# Patient Record
Sex: Female | Born: 1960
Health system: Southern US, Community
[De-identification: ages and names within clinical notes are randomized; demographics above are authoritative.]

## PROBLEM LIST (undated history)

## (undated) DIAGNOSIS — J159 Unspecified bacterial pneumonia: Secondary | ICD-10-CM

## (undated) DIAGNOSIS — G43909 Migraine, unspecified, not intractable, without status migrainosus: Secondary | ICD-10-CM

## (undated) DIAGNOSIS — D249 Benign neoplasm of unspecified breast: Secondary | ICD-10-CM

## (undated) DIAGNOSIS — F419 Anxiety disorder, unspecified: Secondary | ICD-10-CM

## (undated) DIAGNOSIS — Z803 Family history of malignant neoplasm of breast: Secondary | ICD-10-CM

## (undated) DIAGNOSIS — Z1371 Encounter for nonprocreative screening for genetic disease carrier status: Secondary | ICD-10-CM

## (undated) DIAGNOSIS — F319 Bipolar disorder, unspecified: Secondary | ICD-10-CM

## (undated) DIAGNOSIS — IMO0002 Reserved for concepts with insufficient information to code with codable children: Secondary | ICD-10-CM

## (undated) DIAGNOSIS — F32A Depression, unspecified: Secondary | ICD-10-CM

## (undated) DIAGNOSIS — E559 Vitamin D deficiency, unspecified: Secondary | ICD-10-CM

## (undated) DIAGNOSIS — K589 Irritable bowel syndrome without diarrhea: Secondary | ICD-10-CM

## (undated) DIAGNOSIS — F329 Major depressive disorder, single episode, unspecified: Secondary | ICD-10-CM

## (undated) DIAGNOSIS — Z9189 Other specified personal risk factors, not elsewhere classified: Secondary | ICD-10-CM

## (undated) DIAGNOSIS — J309 Allergic rhinitis, unspecified: Secondary | ICD-10-CM

## (undated) DIAGNOSIS — L719 Rosacea, unspecified: Secondary | ICD-10-CM

## (undated) DIAGNOSIS — J45909 Unspecified asthma, uncomplicated: Secondary | ICD-10-CM

## (undated) HISTORY — DX: Unspecified asthma, uncomplicated: J45.909

## (undated) HISTORY — PX: OTHER SURGICAL HISTORY: SHX169

## (undated) HISTORY — DX: Unspecified bacterial pneumonia: J15.9

## (undated) HISTORY — DX: Reserved for concepts with insufficient information to code with codable children: IMO0002

## (undated) HISTORY — DX: Irritable bowel syndrome, unspecified: K58.9

## (undated) HISTORY — DX: Encounter for nonprocreative screening for genetic disease carrier status: Z13.71

## (undated) HISTORY — DX: Family history of malignant neoplasm of breast: Z80.3

## (undated) HISTORY — DX: Depression, unspecified: F32.A

## (undated) HISTORY — DX: Allergic rhinitis, unspecified: J30.9

## (undated) HISTORY — DX: Major depressive disorder, single episode, unspecified: F32.9

## (undated) HISTORY — DX: Bipolar disorder, unspecified: F31.9

## (undated) HISTORY — PX: PECTUS EXCAVATUM REPAIR: SHX437

## (undated) HISTORY — DX: Anxiety disorder, unspecified: F41.9

## (undated) HISTORY — DX: Vitamin D deficiency, unspecified: E55.9

## (undated) HISTORY — DX: Migraine, unspecified, not intractable, without status migrainosus: G43.909

## (undated) HISTORY — PX: WISDOM TOOTH EXTRACTION: SHX21

## (undated) HISTORY — DX: Rosacea, unspecified: L71.9

## (undated) HISTORY — DX: Other specified personal risk factors, not elsewhere classified: Z91.89

## (undated) HISTORY — DX: Benign neoplasm of unspecified breast: D24.9

## (undated) HISTORY — PX: TONSILLECTOMY: SUR1361

---

## 1998-07-16 HISTORY — PX: BREAST BIOPSY: SHX20

## 2002-07-16 HISTORY — PX: NASAL SINUS SURGERY: SHX719

## 2004-07-11 ENCOUNTER — Ambulatory Visit: Payer: Self-pay

## 2005-07-16 HISTORY — PX: BREAST CYST ASPIRATION: SHX578

## 2006-04-12 ENCOUNTER — Ambulatory Visit: Payer: Self-pay

## 2006-04-15 HISTORY — PX: BREAST CYST ASPIRATION: SHX578

## 2006-10-16 ENCOUNTER — Ambulatory Visit: Payer: Self-pay | Admitting: General Surgery

## 2007-01-09 ENCOUNTER — Emergency Department: Payer: Self-pay | Admitting: Emergency Medicine

## 2007-04-19 ENCOUNTER — Observation Stay: Payer: Self-pay | Admitting: Internal Medicine

## 2007-11-27 ENCOUNTER — Ambulatory Visit: Payer: Self-pay

## 2008-12-14 DIAGNOSIS — J159 Unspecified bacterial pneumonia: Secondary | ICD-10-CM

## 2008-12-14 HISTORY — DX: Unspecified bacterial pneumonia: J15.9

## 2008-12-15 ENCOUNTER — Inpatient Hospital Stay: Payer: Self-pay | Admitting: Internal Medicine

## 2009-01-05 ENCOUNTER — Ambulatory Visit: Payer: Self-pay | Admitting: Internal Medicine

## 2009-01-20 ENCOUNTER — Ambulatory Visit: Payer: Self-pay | Admitting: Specialist

## 2009-04-05 ENCOUNTER — Ambulatory Visit: Payer: Self-pay

## 2009-04-14 ENCOUNTER — Ambulatory Visit: Payer: Self-pay | Admitting: Unknown Physician Specialty

## 2009-05-31 ENCOUNTER — Ambulatory Visit: Payer: Self-pay | Admitting: Specialist

## 2009-06-06 ENCOUNTER — Ambulatory Visit: Payer: Self-pay | Admitting: Unknown Physician Specialty

## 2010-04-10 ENCOUNTER — Ambulatory Visit: Payer: Self-pay

## 2011-07-17 DIAGNOSIS — Z1371 Encounter for nonprocreative screening for genetic disease carrier status: Secondary | ICD-10-CM

## 2011-07-17 HISTORY — DX: Encounter for nonprocreative screening for genetic disease carrier status: Z13.71

## 2012-08-29 LAB — DRUG SCREEN, URINE
Barbiturates, Ur Screen: NEGATIVE (ref ?–200)
Cannabinoid 50 Ng, Ur ~~LOC~~: NEGATIVE (ref ?–50)
Methadone, Ur Screen: NEGATIVE (ref ?–300)
Opiate, Ur Screen: NEGATIVE (ref ?–300)
Phencyclidine (PCP) Ur S: NEGATIVE (ref ?–25)
Tricyclic, Ur Screen: NEGATIVE (ref ?–1000)

## 2012-08-29 LAB — URINALYSIS, COMPLETE
Blood: NEGATIVE
Ketone: NEGATIVE
Leukocyte Esterase: NEGATIVE
Nitrite: NEGATIVE
RBC,UR: <1 /HPF (ref 0–5)
Specific Gravity: 1.02 (ref 1.003–1.030)
Squamous Epithelial: NONE SEEN
WBC UR: NONE SEEN /HPF (ref 0–5)

## 2012-08-29 LAB — COMPREHENSIVE METABOLIC PANEL
Albumin: 3.7 g/dL (ref 3.4–5.0)
Anion Gap: 8 (ref 7–16)
Bilirubin,Total: 0.4 mg/dL (ref 0.2–1.0)
Co2: 24 mmol/L (ref 21–32)
Creatinine: 0.56 mg/dL — ABNORMAL LOW (ref 0.60–1.30)
EGFR (African American): >60
Glucose: 116 mg/dL — ABNORMAL HIGH (ref 65–99)
Osmolality: 286 (ref 275–301)
Potassium: 3.5 mmol/L (ref 3.5–5.1)
SGOT(AST): 20 U/L (ref 15–37)

## 2012-08-29 LAB — CBC WITH DIFFERENTIAL/PLATELET
Basophil #: 0 10*3/uL (ref 0.0–0.1)
Eosinophil #: 0.1 10*3/uL (ref 0.0–0.7)
HGB: 14.2 g/dL (ref 12.0–16.0)
Lymphocyte %: 32.8 %
MCHC: 33.7 g/dL (ref 32.0–36.0)
MCV: 90 fL (ref 80–100)
Monocyte %: 11.4 %
Neutrophil #: 2.3 10*3/uL (ref 1.4–6.5)
RBC: 4.67 10*6/uL (ref 3.80–5.20)
WBC: 4.3 10*3/uL (ref 3.6–11.0)

## 2012-08-29 LAB — ETHANOL: Ethanol %: <0.003 % (ref 0.000–0.080)

## 2012-08-30 ENCOUNTER — Inpatient Hospital Stay: Payer: Self-pay | Admitting: Psychiatry

## 2012-08-30 LAB — ACETAMINOPHEN LEVEL: Acetaminophen: <2 ug/mL

## 2013-03-31 ENCOUNTER — Ambulatory Visit: Payer: Self-pay | Admitting: Orthopedic Surgery

## 2013-11-20 ENCOUNTER — Ambulatory Visit: Payer: Self-pay

## 2013-11-25 ENCOUNTER — Encounter: Payer: Self-pay | Admitting: General Surgery

## 2013-11-25 ENCOUNTER — Ambulatory Visit (INDEPENDENT_AMBULATORY_CARE_PROVIDER_SITE_OTHER): Payer: BC Managed Care – PPO | Admitting: General Surgery

## 2013-11-25 VITALS — BP 120/80 | HR 78 | Resp 14 | Ht 63.0 in | Wt 185.0 lb

## 2013-11-25 DIAGNOSIS — R920 Mammographic microcalcification found on diagnostic imaging of breast: Secondary | ICD-10-CM

## 2013-11-25 NOTE — Patient Instructions (Signed)
Patient to return in 6 months with left breast diagnostic mammogram. Continue self breast exams. Call office for any new breast issues or concerns.  

## 2013-11-25 NOTE — Progress Notes (Addendum)
Patient ID: Krystal Smith, female   DOB: 15-Mar-1961, 53 y.o.   MRN: [id number]  Chief Complaint  Patient presents with  . Breast Problem    abnormal mammogram    HPI Krystal Smith is a 53 y.o. female who presents for a breast evaluation. The most recent mammogram was done on 11/20/13. Patient does perform regular self breast checks but has not been getting mammograms on a regular basis. The patient has a family history of breast cancer. She has always had bilateral breast tenderness since having breast biopsies but no new problems with the breasts. The tenderness has gotten better since her periods have stopped.   HPI  Past Medical History  Diagnosis Date  . Asthma   . Anxiety   . Bipolar disorder   . IBS (irritable bowel syndrome)   . Infertility   . Depression   . Migraine   . Vitamin D deficiency   . Rosacea   . Fibroadenoma of breast   . BRCA negative 2013    [facility]    Past Surgical History  Procedure Laterality Date  . Breast cyst aspiration Left 2007  . Breast biopsy Right 2000, 2007    fibroadenoma  . Tonsillectomy    . Wisdom tooth extraction    . Nasal sinus surgery  2004    Family History  Problem Relation Age of Onset  . Cancer Mother 2    breast  . Cancer Maternal Aunt 41    breast  . Cancer Father     lymphoma    Social History History  Substance Use Topics  . Smoking status: Former Research scientist (life sciences)  . Smokeless tobacco: Not on file  . Alcohol Use: No    Allergies  Allergen Reactions  . Amoxil [Amoxicillin]   . Erythromycin   . Imitrex [Sumatriptan]   . Skelaxin [Metaxalone]     Current Outpatient Prescriptions  Medication Sig Dispense Refill  . ABILIFY 15 MG tablet Take 15 mg by mouth daily.       Marland Kitchen albuterol (PROVENTIL) (2.5 MG/3ML) 0.083% nebulizer solution Take 2.5 mg by nebulization every 6 (six) hours as needed for wheezing or shortness of breath.      . ALPRAZolam (XANAX) 0.5 MG tablet Take 0.5 mg by mouth 3 (three) times  daily as needed.       . Aspirin-Acetaminophen-Caffeine (GOODY HEADACHE PO) Take by mouth as needed.      . budesonide-formoterol (SYMBICORT) 160-4.5 MCG/ACT inhaler Inhale 2 puffs into the lungs 2 (two) times daily.      Marland Kitchen lamoTRIgine (LAMICTAL) 200 MG tablet Take 200 mg by mouth daily.       Marland Kitchen zolpidem (AMBIEN) 10 MG tablet Take 10 mg by mouth at bedtime.        No current facility-administered medications for this visit.    Review of Systems Review of Systems  Constitutional: Negative.   Respiratory: Negative.   Cardiovascular: Negative.     Blood pressure 120/80, pulse 78, resp. rate 14, height $RemoveBe'5\' 3"'aNpSmSpKv$  (1.6 m), weight 185 lb (83.915 kg).  Physical Exam Physical Exam  Constitutional: She is oriented to person, place, and time. She appears well-developed and well-nourished.  Neck: Neck supple. No thyromegaly present.  Cardiovascular: Normal rate, regular rhythm and normal heart sounds.   No murmur heard. Pulmonary/Chest: Effort normal and breath sounds normal. Right breast exhibits no inverted nipple, no mass, no nipple discharge, no skin change and no tenderness. Left breast exhibits no inverted nipple, no mass, no nipple  discharge, no skin change and no tenderness.  1 Right high axilla healing cysts. 2 on left.  Lymphadenopathy:    She has no cervical adenopathy.    She has no axillary adenopathy.  Neurological: She is alert and oriented to person, place, and time.  Skin: Skin is warm and dry.    Data Reviewed [facility] mammogram dated 10/29/2013 suggested calcifications in the left breast for which additional images were recommended. BI-RAD-0.  Focal spot compression views of the left breast dated 11/20/2013 showed 2 adjacent clusters of microcalcifications in the upper-outer quadrant each measuring 8 mm in reported diameter. Determined for malignancy. BI-RAD-4. Biopsy recommended.  Assessment     indeterminate mammogram, new microcalcification since 2010.     Plan     Options for management were reviewed: 1) early biopsy versus 2) 6 month followup (rather than 54 month followup). At this time the patient desires conservative observation. She'll be asked to return with a diagnostic left breast mammogram in 6 months. If calcifications have increased she will strongly be encouraged to proceed to biopsy.    PCP: Kirk Ruths Ref. MD: Jorja Loa Midwife/[place]   Forest Gleason Byrnett 12/01/2013, 8:07 AM

## 2013-11-26 ENCOUNTER — Encounter: Payer: Self-pay | Admitting: General Surgery

## 2013-11-26 DIAGNOSIS — R920 Mammographic microcalcification found on diagnostic imaging of breast: Secondary | ICD-10-CM | POA: Insufficient documentation

## 2014-05-17 ENCOUNTER — Encounter: Payer: Self-pay | Admitting: General Surgery

## 2014-06-01 ENCOUNTER — Encounter: Payer: Self-pay | Admitting: General Surgery

## 2014-06-01 ENCOUNTER — Ambulatory Visit: Payer: Self-pay | Admitting: General Surgery

## 2014-06-08 ENCOUNTER — Encounter: Payer: Self-pay | Admitting: General Surgery

## 2014-06-08 ENCOUNTER — Ambulatory Visit (INDEPENDENT_AMBULATORY_CARE_PROVIDER_SITE_OTHER): Payer: BC Managed Care – PPO | Admitting: General Surgery

## 2014-06-08 VITALS — BP 130/74 | HR 88 | Resp 14 | Ht 63.0 in | Wt 169.0 lb

## 2014-06-08 DIAGNOSIS — R920 Mammographic microcalcification found on diagnostic imaging of breast: Secondary | ICD-10-CM

## 2014-06-08 NOTE — Progress Notes (Addendum)
Patient ID: Krystal Smith, female   DOB: May 11, 1961, 53 y.o.   MRN: [id number]  Chief Complaint  Patient presents with  . Follow-up    mammogram    HPI Krystal Smith is a 53 y.o. female who presents for a breast evaluation. The most recent left breast  mammogram was done on 06/01/14.  Patient does perform regular self breast checks and gets regular mammograms done.  She does remember an injury to the left breast occurred in  August 2014. She did not mention this history of trauma at the time of her initial evaluation in May 2015.)  She still has occasionally tenderness, but no new symptoms. She does admit to hot flashes worse at night. Her psychiatrist is adjusting her medications to help her sleep.  HPI  Past Medical History  Diagnosis Date  . Asthma   . Anxiety   . Bipolar disorder   . IBS (irritable bowel syndrome)   . Infertility   . Depression   . Migraine   . Vitamin D deficiency   . Rosacea   . Fibroadenoma of breast   . BRCA negative 2013    [facility]    Past Surgical History  Procedure Laterality Date  . Tonsillectomy    . Wisdom tooth extraction    . Nasal sinus surgery  2004  . Pectus excavatum repair      age 56 at [facility]  . Breast cyst aspiration Left 2007    FNA negative for malignancy.  . Breast biopsy Right 2000    fibroadenoma, core biopsy.  . Breast cyst aspiration Right 2007    Family History  Problem Relation Age of Onset  . Cancer Mother 28    breast  . Cancer Maternal Aunt 52    breast  . Cancer Father     lymphoma    Social History History  Substance Use Topics  . Smoking status: Former Research scientist (life sciences)  . Smokeless tobacco: Never Used  . Alcohol Use: No    Allergies  Allergen Reactions  . Amoxil [Amoxicillin]   . Erythromycin   . Imitrex [Sumatriptan]   . Skelaxin [Metaxalone]     Current Outpatient Prescriptions  Medication Sig Dispense Refill  . albuterol (PROVENTIL) (2.5 MG/3ML) 0.083% nebulizer solution Take  2.5 mg by nebulization every 6 (six) hours as needed for wheezing or shortness of breath.    . ALPRAZolam (XANAX) 0.5 MG tablet Take 0.5 mg by mouth 3 (three) times daily as needed.     . Aspirin-Acetaminophen-Caffeine (GOODY HEADACHE PO) Take by mouth as needed.    . budesonide-formoterol (SYMBICORT) 160-4.5 MCG/ACT inhaler Inhale 2 puffs into the lungs 2 (two) times daily.    Marland Kitchen buPROPion (WELLBUTRIN XL) 300 MG 24 hr tablet Take 300 mg by mouth daily.     . Eszopiclone 3 MG TABS     . lamoTRIgine (LAMICTAL) 200 MG tablet Take 200 mg by mouth daily.     Marland Kitchen zolpidem (AMBIEN) 10 MG tablet Take 10 mg by mouth at bedtime.      No current facility-administered medications for this visit.    Review of Systems Review of Systems  Constitutional: Negative.   Respiratory: Negative.   Cardiovascular: Negative.     Blood pressure 130/74, pulse 88, resp. rate 14, height _0  (1.6 m), weight 169 lb (76.658 kg).  Physical Exam Physical Exam  Constitutional: She is oriented to person, place, and time. She appears well-developed and well-nourished.  Neck: Neck supple.  Cardiovascular:  Normal rate, regular rhythm and normal heart sounds.   Pulmonary/Chest: Effort normal and breath sounds normal. Right breast exhibits no inverted nipple, no mass, no nipple discharge, no skin change and no tenderness. Left breast exhibits no inverted nipple, no mass, no nipple discharge, no skin change and no tenderness.  Lymphadenopathy:    She has no cervical adenopathy.    She has no axillary adenopathy.  Neurological: She is alert and oriented to person, place, and time.  Skin: Skin is warm and dry.    Data Reviewed Left breast diagnostic mammogram dated 06/01/2014 showed no interval change. BI-RADS-4.  Assessment    Stable microcalcifications of the breast, consistent with traumatic injury noted above.    Plan       Options for management were reviewed and remain the same as in May 2015: 1) observation  versus 2) stereotactic biopsy. In the past she had been reluctant to consider biopsy, but now she is desirous to proceed.  She reports being anxious, and she was encouraged to contact her psychiatrist for his recommendations regarding medications prior to the procedure to make it more tolerable. Informed consent was obtained today.  The stereotactic procedure was reviewed with the patient. The potential for bleeding, infection and pain was reviewed. At this time, the benefits outweigh the risk, and the patient is amenable to proceed.  Patient has been scheduled for a left breast stereotactic biopsy at [facility] for 06/21/14 at 1:00 pm. She will check-in at the [facility] at 12:30 pm. This patient is aware of date, time, and instructions. A consent for has been signed by the patient. Patient verbalizes understanding.  PCP:  Sol Passer 06/10/2014, 6:03 AM

## 2014-06-08 NOTE — Patient Instructions (Addendum)
Stereotactic Breast Biopsy A stereotactic breast biopsy is a procedure in which mammography is used in the collection of a sample of breast tissue. Mammography is a type of X-ray exam of the breasts that produces an image called a mammogram. The mammogram allows your health care provider to precisely locate the area of the breast from which a tissue sample will be taken. The tissue is then examined under a microscope to see if cancerous cells are present. A breast biopsy is done when:   A lump, abnormality, or mass is seen in the breast on a breast X-ray (mammogram).   Small calcium deposits (calcifications) are seen in the breast.   The shape or appearance of the breasts changes.   The shape or appearance of the nipples changes. You may have unusual or bloody discharge coming from the nipples, or you may have crusting, retraction, or dimpling of the nipples. A breast biopsy can indicate if you need surgery or other treatment.  LET YOUR HEALTH CARE PROVIDER KNOW ABOUT:  Any allergies you have.  All medicines you are taking, including vitamins, herbs, eye drops, creams, and over-the-counter medicines.  Previous problems you or members of your family have had with the use of anesthetics.  Any blood disorders you have.  Previous surgeries you have had.  Medical conditions you have. RISKS AND COMPLICATIONS Generally, stereotactic breast biopsy is a safe procedure. However, as with any procedure, complications can occur. Possible complications include:  Infection at the needle-insertion site.   Bleeding or bruising after surgery.  The breast may become altered or deformed as a result of the procedure.  The needle may go through the chest wall into the lung area.  BEFORE THE PROCEDURE  Wear a supportive bra to the procedure.  You will be asked to remove jewelry, dentures, eyeglasses, metal objects, or clothing that might interfere with the X-ray images. You may want to leave  some of these objects at home.  Arrange for someone to drive you home after the procedure if desired. PROCEDURE  A stereotactic breast biopsy is done while you are awake. During the procedure, relax as much as possible. Let your health care provider know if you are uncomfortable, anxious, or in pain. Usually, the only discomfort felt during the procedure is caused by staying in one position for the length of the procedure. This discomfort can be reduced by carefully placed cushions. Most of the time the biopsy is done using a table with openings on it. You will be asked to lie facedown on the table and place your breasts through the openings. Your breast is compressed between metal plates to get good X-ray images. Your skin will be cleaned, and a numbing medicine (local anesthetic) will be injected. A small cut (incision) will be made in your breast. The tip of the biopsy needle will be directed through the incision. Several small pieces of suspicious tissue will be taken. Then, a final set of X-ray images will be obtained. If they show that the suspicious tissue has been mostly or completely removed, a small clip will be left at the biopsy site. This is done so that the biopsy site can be easily located if the results of the biopsy show that the tissue is cancerous.  After the procedure, the incision will be stitched (sutured) or taped and covered with a bandage (dressing). Your health care provider may apply a pressure dressing and an ice pack to prevent bleeding and swelling in the breast.  A stereotactic   breast biopsy can take 30 minutes or more. AFTER THE PROCEDURE  If you are doing well and have no problems, you will be allowed to go home.  Document Released: 03/31/2003 Document Revised: 07/07/2013 Document Reviewed: 01/29/2013 [facility] Patient Information 2015 [place], [place]. This information is not intended to replace advice given to you by your health care provider. Make sure you discuss any  questions you have with your health care provider.  Patient has been scheduled for a left breast stereotactic biopsy at [facility] for 06/21/14 at 1:00 pm. She will check-in at the [facility] at 12:30 pm. This patient is aware of date, time, and instructions. A consent for has been signed by the patient. Patient verbalizes understanding.

## 2014-06-10 ENCOUNTER — Encounter: Payer: Self-pay | Admitting: General Surgery

## 2014-06-21 ENCOUNTER — Ambulatory Visit: Payer: BC Managed Care – PPO | Admitting: General Surgery

## 2014-06-21 ENCOUNTER — Ambulatory Visit: Payer: Self-pay | Admitting: General Surgery

## 2014-06-21 DIAGNOSIS — R920 Mammographic microcalcification found on diagnostic imaging of breast: Secondary | ICD-10-CM

## 2014-06-21 DIAGNOSIS — T888XXA Other specified complications of surgical and medical care, not elsewhere classified, initial encounter: Secondary | ICD-10-CM

## 2014-06-21 HISTORY — PX: BREAST BIOPSY: SHX20

## 2014-06-22 ENCOUNTER — Encounter: Payer: Self-pay | Admitting: General Surgery

## 2014-06-22 ENCOUNTER — Telehealth: Payer: Self-pay | Admitting: *Deleted

## 2014-06-22 DIAGNOSIS — IMO0002 Reserved for concepts with insufficient information to code with codable children: Secondary | ICD-10-CM | POA: Insufficient documentation

## 2014-06-22 NOTE — Telephone Encounter (Signed)
She states she is doing good, minimal pain. She states it "is not as bad as she thought it would be". Discussed continued care. She is aware to call for concerns. Appreciates phone call.

## 2014-06-22 NOTE — Progress Notes (Signed)
Patient ID: Krystal Smith, female   DOB: 07-23-1960, 53 y.o.   MRN: [id number]  No chief complaint on file.   HPI Krystal Smith is a 53 y.o. female. She underwent a stereotactic biopsy today of the left breast for a foci of microcalcifications. A hematoma was noted by the technologist, and she came to the office for assessment. HPI  Past Medical History  Diagnosis Date  . Asthma   . Anxiety   . Bipolar disorder   . IBS (irritable bowel syndrome)   . Infertility   . Depression   . Migraine   . Vitamin D deficiency   . Rosacea   . Fibroadenoma of breast   . BRCA negative 2013    [facility]    Past Surgical History  Procedure Laterality Date  . Tonsillectomy    . Wisdom tooth extraction    . Nasal sinus surgery  2004  . Pectus excavatum repair      age 54 at [facility]  . Breast cyst aspiration Left 2007    FNA negative for malignancy.  . Breast biopsy Right 2000    fibroadenoma, core biopsy.  . Breast cyst aspiration Right 2007    Family History  Problem Relation Age of Onset  . Cancer Mother 65    breast  . Cancer Maternal Aunt 86    breast  . Cancer Father     lymphoma    Social History History  Substance Use Topics  . Smoking status: Former Research scientist (life sciences)  . Smokeless tobacco: Never Used  . Alcohol Use: No    Allergies  Allergen Reactions  . Amoxil [Amoxicillin]   . Erythromycin   . Imitrex [Sumatriptan]   . Skelaxin [Metaxalone]     Current Outpatient Prescriptions  Medication Sig Dispense Refill  . albuterol (PROVENTIL) (2.5 MG/3ML) 0.083% nebulizer solution Take 2.5 mg by nebulization every 6 (six) hours as needed for wheezing or shortness of breath.    . ALPRAZolam (XANAX) 0.5 MG tablet Take 0.5 mg by mouth 3 (three) times daily as needed.     . Aspirin-Acetaminophen-Caffeine (GOODY HEADACHE PO) Take by mouth as needed.    . budesonide-formoterol (SYMBICORT) 160-4.5 MCG/ACT inhaler Inhale 2 puffs into the lungs 2 (two) times daily.    Marland Kitchen  buPROPion (WELLBUTRIN XL) 300 MG 24 hr tablet Take 300 mg by mouth daily.     . Eszopiclone 3 MG TABS     . lamoTRIgine (LAMICTAL) 200 MG tablet Take 200 mg by mouth daily.     Marland Kitchen zolpidem (AMBIEN) 10 MG tablet Take 10 mg by mouth at bedtime.      No current facility-administered medications for this visit.    Review of Systems Review of Systems  There were no vitals taken for this visit.  Physical Exam Physical Exam 3 cm area of fullness in the upper outer quadrant of the left breast.   Assessment    Hematoma status post stereotactic biopsy.    Plan    The area was prepped with ChloraPrep and additional Xylocaine 0.5% with Marcaine 0.25% with 1-200,000 of epinephrine was instilled. A hemostat was used to evacuate the hematoma. The cavity was then irrigated with the above mentioned local anesthetic. Direct pressure was held for 5 minutes and no additional bleeding was noted. The skin defect was closed with a single 4-0 nylon stitch. A dry dressing with Telfa was applied. Ice pack placed.  Patient was discharged in regards to wound care.  She'll follow up  next week as previously scheduled.       Robert Bellow 06/22/2014, 11:56 AM

## 2014-06-22 NOTE — Telephone Encounter (Signed)
-----   Message from Robert Bellow, MD sent at 06/22/2014 11:59 AM EST -----  the patient developed a hematoma after yesterday stereotactic biopsy requiring drainage. Please contact her and see how she's done overnight. Thank you

## 2014-06-23 ENCOUNTER — Telehealth: Payer: Self-pay | Admitting: *Deleted

## 2014-06-23 ENCOUNTER — Encounter: Payer: Self-pay | Admitting: General Surgery

## 2014-06-23 NOTE — Telephone Encounter (Signed)
Notified patient as instructed, patient pleased. Discussed follow-up appointments, patient agrees. Placed in recalls for 6 mon bilateral DX mammogram and OV.

## 2014-06-28 ENCOUNTER — Ambulatory Visit: Payer: BC Managed Care – PPO

## 2014-06-29 ENCOUNTER — Ambulatory Visit (INDEPENDENT_AMBULATORY_CARE_PROVIDER_SITE_OTHER): Payer: Self-pay | Admitting: General Surgery

## 2014-06-29 ENCOUNTER — Encounter: Payer: Self-pay | Admitting: General Surgery

## 2014-06-29 VITALS — BP 130/70 | HR 76 | Resp 12 | Ht 63.0 in | Wt 166.0 lb

## 2014-06-29 DIAGNOSIS — R920 Mammographic microcalcification found on diagnostic imaging of breast: Secondary | ICD-10-CM

## 2014-06-29 NOTE — Progress Notes (Signed)
Patient ID: Krystal Smith, female   DOB: October 01, 1960, 53 y.o.   MRN: [id number]  Chief Complaint  Patient presents with  . Routine Post Op    HPI Krystal Smith is a 53 y.o. female.  Here today post stereotatic biopsy complete 06-21-14, she developed a hematoma post biopsy. She states the area is still hard but improving.  BRCA testing negative. HPI  Past Medical History  Diagnosis Date  . Asthma   . Anxiety   . Bipolar disorder   . IBS (irritable bowel syndrome)   . Infertility   . Depression   . Migraine   . Vitamin D deficiency   . Rosacea   . Fibroadenoma of breast   . BRCA negative 2013    [facility]    Past Surgical History  Procedure Laterality Date  . Tonsillectomy    . Wisdom tooth extraction    . Nasal sinus surgery  2004  . Pectus excavatum repair      age 73 at [facility]  . Breast cyst aspiration Left 2007    FNA negative for malignancy.  . Breast biopsy Right 2000    fibroadenoma, core biopsy.  . Breast cyst aspiration Right 2007  . Breast biopsy Left 06-21-14    stereotatic, BRCA testing neagative    Family History  Problem Relation Age of Onset  . Cancer Mother 7    breast  . Cancer Maternal Aunt 30    breast  . Cancer Father     lymphoma    Social History History  Substance Use Topics  . Smoking status: Former Research scientist (life sciences)  . Smokeless tobacco: Never Used  . Alcohol Use: No    Allergies  Allergen Reactions  . Amitriptyline Other (See Comments)    Nervous ending pulse  . Amoxil [Amoxicillin]   . Erythromycin   . Imitrex [Sumatriptan]   . Skelaxin [Metaxalone]   . Ultracet [Tramadol-Acetaminophen] Itching    Current Outpatient Prescriptions  Medication Sig Dispense Refill  . albuterol (PROVENTIL) (2.5 MG/3ML) 0.083% nebulizer solution Take 2.5 mg by nebulization every 6 (six) hours as needed for wheezing or shortness of breath.    . ALPRAZolam (XANAX) 0.5 MG tablet Take 0.5 mg by mouth 3 (three) times daily as needed.      . Aspirin-Acetaminophen-Caffeine (GOODY HEADACHE PO) Take by mouth as needed.    . budesonide-formoterol (SYMBICORT) 160-4.5 MCG/ACT inhaler Inhale 2 puffs into the lungs 2 (two) times daily.    Marland Kitchen buPROPion (WELLBUTRIN XL) 300 MG 24 hr tablet Take 300 mg by mouth daily.     Marland Kitchen lamoTRIgine (LAMICTAL) 200 MG tablet Take 200 mg by mouth daily.     Marland Kitchen MELATONIN PO Take 20 mg by mouth.    . zolpidem (AMBIEN) 10 MG tablet Take 10 mg by mouth at bedtime.      No current facility-administered medications for this visit.    Review of Systems Review of Systems  Constitutional: Negative.   Respiratory: Negative.   Cardiovascular: Negative.     Blood pressure 130/70, pulse 76, resp. rate 12, height 5' 3" (1.6 m), weight 166 lb (75.297 kg).  Physical Exam Physical Exam  Constitutional: She is oriented to person, place, and time. She appears well-developed and well-nourished.  Pulmonary/Chest: Left breast exhibits skin change and tenderness.  Extensive ecchymosis involving entire left breast, 3-4 cm soft fullness at 2 o'clock left breast  Neurological: She is alert and oriented to person, place, and time.  Skin: Skin is warm  and dry.    Data Reviewed  Biopsy included the dominant area of microcalcifications. A smaller adjacent area was not sampled. We'll plan on obtaining bilateral diagnostic mammograms in 6 months to put her back on schedule.  Assessment    Residual hematoma status post vacuum biopsy. Resolving.    Plan    The patient will use heat to accelerate resolution as she is able.    Follow up in 6 months with bilateral screening breast mammogram and office visit.  PCP:  Sol Passer 06/30/2014, 3:14 PM

## 2014-06-29 NOTE — Patient Instructions (Signed)
Follow up in 6 months with bilateral screening breast mammogram and office visit.

## 2014-07-07 ENCOUNTER — Ambulatory Visit: Payer: BC Managed Care – PPO | Admitting: General Surgery

## 2014-08-16 ENCOUNTER — Ambulatory Visit (INDEPENDENT_AMBULATORY_CARE_PROVIDER_SITE_OTHER): Payer: Self-pay | Admitting: General Surgery

## 2014-08-16 ENCOUNTER — Encounter: Payer: Self-pay | Admitting: General Surgery

## 2014-08-16 VITALS — Ht 63.0 in | Wt 162.0 lb

## 2014-08-16 DIAGNOSIS — R920 Mammographic microcalcification found on diagnostic imaging of breast: Secondary | ICD-10-CM

## 2014-08-16 DIAGNOSIS — T888XXD Other specified complications of surgical and medical care, not elsewhere classified, subsequent encounter: Secondary | ICD-10-CM

## 2014-08-16 NOTE — Progress Notes (Signed)
Patient ID: Krystal Smith, female   DOB: 12-19-1960, 54 y.o.   MRN: [id number]  Chief Complaint  Patient presents with  . Follow-up    left breast problems    HPI Krystal Smith is a 54 y.o. female  Has noticed a hard spot on her left breast in the area of her post stereo biopsy hematoma. She has been using the heating pad on the area as instructed. She states the area decreased in size and has now gotten hard. She reports no pain.  HPI  Past Medical History  Diagnosis Date  . Asthma   . Anxiety   . Bipolar disorder   . IBS (irritable bowel syndrome)   . Infertility   . Depression   . Migraine   . Vitamin D deficiency   . Rosacea   . Fibroadenoma of breast   . BRCA negative 2013    [facility]    Past Surgical History  Procedure Laterality Date  . Tonsillectomy    . Wisdom tooth extraction    . Nasal sinus surgery  2004  . Pectus excavatum repair      age 46 at [facility]  . Breast cyst aspiration Left 2007    FNA negative for malignancy.  . Breast biopsy Right 2000    fibroadenoma, core biopsy.  . Breast cyst aspiration Right 2007  . Breast biopsy Left 06-21-14    stereotatic, BRCA testing neagative    Family History  Problem Relation Age of Onset  . Cancer Mother 67    breast  . Cancer Maternal Aunt 38    breast  . Cancer Father     lymphoma    Social History History  Substance Use Topics  . Smoking status: Former Research scientist (life sciences)  . Smokeless tobacco: Never Used  . Alcohol Use: No    Allergies  Allergen Reactions  . Amitriptyline Other (See Comments)    Nervous ending pulse  . Amoxil [Amoxicillin]   . Erythromycin   . Imitrex [Sumatriptan]   . Skelaxin [Metaxalone]   . Ultracet [Tramadol-Acetaminophen] Itching    Current Outpatient Prescriptions  Medication Sig Dispense Refill  . albuterol (PROVENTIL) (2.5 MG/3ML) 0.083% nebulizer solution Take 2.5 mg by nebulization every 6 (six) hours as needed for wheezing or shortness of breath.    .  ALPRAZolam (XANAX) 0.5 MG tablet Take 0.5 mg by mouth 3 (three) times daily as needed.     . Aspirin-Acetaminophen-Caffeine (GOODY HEADACHE PO) Take by mouth as needed.    . budesonide-formoterol (SYMBICORT) 160-4.5 MCG/ACT inhaler Inhale 2 puffs into the lungs 2 (two) times daily.    Marland Kitchen buPROPion (WELLBUTRIN XL) 300 MG 24 hr tablet Take 300 mg by mouth daily.     Marland Kitchen lamoTRIgine (LAMICTAL) 200 MG tablet Take 200 mg by mouth daily.     Marland Kitchen MELATONIN PO Take 20 mg by mouth.    . zolpidem (AMBIEN) 10 MG tablet Take 10 mg by mouth at bedtime.      No current facility-administered medications for this visit.    Review of Systems Review of Systems  Constitutional: Negative.   Respiratory: Negative.   Cardiovascular: Negative.     Height $Remov'5\' 3"'MeqfGy$  (1.6 m), weight 162 lb (73.483 kg).  Physical Exam Physical Exam  Constitutional: She is oriented to person, place, and time. She appears well-developed and well-nourished.  Eyes: Conjunctivae are normal. No scleral icterus.  Neck: Neck supple.  Cardiovascular: Normal rate, regular rhythm and normal heart sounds.   Pulmonary/Chest:  Effort normal and breath sounds normal. Right breast exhibits no inverted nipple, no mass, no nipple discharge, no skin change and no tenderness. Left breast exhibits no inverted nipple, no mass, no nipple discharge, no skin change and no tenderness.    2.5 cm residual hematoma 3 o'clock  Lymphadenopathy:    She has no cervical adenopathy.  Neurological: She is alert and oriented to person, place, and time.    Data Reviewed None  Assessment    Residual hematoma status post stereotactic biopsy.    Plan    The patient was encouraged to continue local application of heat. (She had been doing this once a day, she was encouraged to do twice a day). I anticipate this will slowly resolve over coming months.  We'll plan to complete bilateral diagnostic mammograms in June 2016.      PCP:  Sol Passer 08/18/2014, 8:10 AM

## 2014-08-16 NOTE — Patient Instructions (Signed)
Continue with heat therapy to resolve hematoma.

## 2014-08-20 ENCOUNTER — Telehealth: Payer: Self-pay | Admitting: General Surgery

## 2014-08-20 NOTE — Telephone Encounter (Signed)
The patient was contacted to allow me to submit her medical record and imaging studies for the American Society of Breast Surgeon certification exam. He was amenable. She will be mailed a release of information.

## 2014-10-28 ENCOUNTER — Other Ambulatory Visit: Payer: Self-pay

## 2014-10-28 DIAGNOSIS — Z1231 Encounter for screening mammogram for malignant neoplasm of breast: Secondary | ICD-10-CM

## 2014-11-05 NOTE — Discharge Summary (Signed)
PATIENT NAME:  Krystal Smith, SOLORZANO MR#:  [id number] DATE OF BIRTH:  1960-10-26  DATE OF ADMISSION:  08/30/2012 DATE OF DISCHARGE:  08/31/2012  REASON FOR ADMISSION:  The patient is a 54 year old white female not employed and last worked 12 years ago at [employer] and quit because of adoption Been married for 20 years and lives with her husband along with their 53 year old son. The patient comes for first inpatient hospitalization at [facility], on behavior health, on IVC taken out by her husband because of confusion and reaction to medication and speech rambling with altered mental status.   HISTORY OF PRESENT ILLNESS: According to information obtained, the patient had been taking the following medication: Lamictal 25 mg every day, Depakote 1,000 mg at bedtime and Ambien 10 mg p.o. at bedtime for her bipolar disorder, which has been stabilized. She took amoxicillin, which was prescribed by her primary care physician for fluid behind her ears. After taking a couple of doses, she became very confused and speech became rambling and her husband got concerned and thought she must have had a reaction to the same.  In the Emergency Room, they said she had altered mental status and was concerned about the same and was admitted to inpatient psychiatry for observation and help.  COURSE IN [place]:  She was continued on all of her above-named medications, except her amoxicillin. The patient refused to get a consultation from hospitalist because she was afraid she may be started on an antibiotic and she has to stay longer and she wanted to go home and go to her primary care physician and get a followup with him. During the stay in the hospital, she was given milieu therapy and supportive counseling.  The patient was stable and she was able to rest better and appetite is fair, interactive with other patients and the staff, and by 08/31/2012 husband came to visit and felt that she has  improved and she is doing better and there was no need for her to stay any longer and they plan to take her to her primary physician by 09/01/2012. It was felt by the treatment team and undersigned and the staff that there was no reason to hold the patient any further and that her thoughts were clearer and she could be discharged home with her husband.   MENTAL STATUS AT THE TIME OF DISCHARGE:  The patient is adequately dressed, alert and oriented, pleasant and cooperative. No agitation. Affect is bright and cheerful. Denies feeling depressed. No psychosis. Thoughts are logical and goal directed. Denies suicidal or homicidal  ideas or plans. Insight and judgment fair and adequate.   IMPRESSION:   AXIS I:   1. History of bipolar disorder, depressed, that is stabilized on medication. 2. Nicotine dependence, in remission.  AXIS II:  Deferred.  AXIS III:   1. Labile hypertension, currently stable. 2. Fluid behind the right ear for which she was given amoxicillin to which she probably had a reaction.  AXIS IV:  History of bipolar disorder and is stabilized on medication.  AXIS V:  Global assessment functioning at the time of admission 30, at the time of discharge 60.  The patient is being discharged to the care of her husband and she will keep her followup appointment with her primary care physician.  No medication given during this stay as she has enough at home.   ____________________________ Wallace Cullens. Franchot Mimes, MD skc:sb D: 08/31/2012 16:49:44 ET T: 09/01/2012 11:01:17  ET JOB#: [id number]  cc: Lanyiah Brix K. Franchot Mimes, MD, <Dictator> Dewain Penning MD ELECTRONICALLY SIGNED 09/06/2012 15:27

## 2014-11-05 NOTE — H&P (Signed)
PATIENT NAME:  Krystal Smith, Krystal Smith MR#:  [id number] DATE OF BIRTH:  22-Aug-1960  DATE OF ADMISSION:  08/30/2012  SEX:  Female   RACE:  White   AGE:  54 years  INITIAL ASSESSMENT:  Psychiatric evaluation.  IDENTIFYING INFORMATION:  The patient is a 54 year old white female, not employed, and last worked 12 years ago at [employer] and quit because they adopted a son.  The patient is married for 59 years and lives with her husband, along with their 4 year old adopted son.  The patient comes for first inpatient with psychiatric care at [facility] with the chief complaint of IVC taken out by the husband because of confusion and reaction to medication and speech rambling and altered mental status.   HISTORY OF PRESENT ILLNESS:  The patient reports that the night before she was admitted here, she took her medications, which are as follows:  Lamictal 25 mg, Depakote 1000 mg and Ambien 10 mg, and then she took her amoxicillin, which was given by her primary care physician for fluid behind the ears.  Then, she became confused and probably she had a reaction, but she was not sure, and husband got concerned and brought her to the hospital for help.  She was evaluated in the Emergency Room, and they said she had altered mental status, and they were concerned that she took too much medication, and so they recommended inpatient with psychiatry.  PAST PSYCHIATRIC HISTORY:  No previous history of inpatient with psychiatry.  No history of suicide attempts. Being followed by Dr. Candis Schatz in [place] for bipolar disorder.  Last appointment was a week ago.  The next appointment is in 6 months, and he monitors her medications for bipolar disorder, and he diagnosed her with the same.  FAMILY HISTORY OF MENTAL ILLNESS:  None known for mental illness. No known history of suicide in the family.    FAMILY HISTORY:  Raised by parents.  Father shipped carpet.  He died on April 08, 2012 of multiple physical  problems.  Mother is living; she is 69 years old.  Has 2 older brothers and is close to the family.    PERSONAL HISTORY:  Born in [place].  Graduated from high school.  Has a 4-year college education and 3 years of education in Personnel officer.  She almost did it, and then decided not to get it.  She is a Physiological scientist.     WORK HISTORY:  First job was  at 40 years.  This job lasted for 2 years and quit for more money.  Longest job that she has held was at [employer], and this job lasted for 15 years, and quit because she adopted a Sport and exercise psychologist.  Last worked 12 years ago.     MILITARY HISTORY:  None.   MARRIAGES:  Married once. Married for 20 years.  Husband works on Archivist for computers.  They have 1 adopted son who is 4 years old.   ALCOHOL AND DRUGS:  Has an occasional drink of alcohol. Denies street or prescription drug abuse.  Denies using IV drugs.  Denies smoking.  Quit smoking nicotine cigarettes 19 years ago.  PAST MEDICAL HISTORY:  Labile hypertension.  No known history of diabetes mellitus.  No major surgeries. No major injuries.  No history of motor vehicle accident or being unconscious.    ALLERGIES:  SKELAXIN AND IMITREX.    Has migraine headaches and is on medications and is being treated for the same.  The patient  thinks she is on Depakote for migraine headaches, along with bipolar disorder.  Being followed by Dr. Ouida Sills and his PA, [facility], and last appointment with Dr. Ouida Sills was probably a few months ago, saw his PA, Mamie, a week ago and next appointment is coming up in a few months.  PHYSICAL EXAMINATION:  VITAL SIGNS:  Temperature is 98.4, pulse is 84 per minute, regular; respirations 20 per minute, regular; blood pressure is 150/80 mmHg. HEENT:  Head is normocephalic and atraumatic.  Eyes: PERRLA. Fundi bilaterally benign. EOMs visualized. Tympanic membranes:  No exudate. NECK:  Supple, without any organomegaly, lymphadenopathy or thyromegaly. CHEST:   Normal expansion, normal breath sounds. HEART:  Normal S1, S2, without any murmurs or gallops. ABDOMEN:  Soft. No organomegaly. Bowel sounds heard.   RECTAL AND PELVIC:  Deferred. NEUROLOGIC:  Gait is normal. Romberg is negative Cranial nerves II through XII intact.  DTRs 2+ and positive  MENTAL STATUS EXAMINATION:  The patient is dressed in hospital scrubs. Alert, but she did not know the date, and she said it was February 2014.  She knew the reason why she was here.  Denies feeling depressed. Denies feeling hopeless or helpless. Denies feeling worthless or useless. No psychosis.  Denies auditory or visual hallucinations.  Denies any delusions or paranoid thinking.  Could spell the word "world" forward and backward.  Recall was good.  She could count money.  She believes that she was probably confused because she had a reaction to amoxicillin, but currently she is more alert and oriented and is eager to go home. Denies any appetite or sleep disturbance.  Denies any ideas of trying to hurt herself or others. Insight and judgment fair.     IMPRESSION:   AXIS I:  History of bipolar disorder, depressed, that is stabilized on medication.  Nicotine dependence, in remission. AXIS II:  Deferred. AXIS III:  Labile hypertension.  Fluid behind the right ear and was given amoxicillin, to which she probably had a reaction. AXIS IV:   History of bipolar disorder, and is stabilized on medication.   AXIS IV:  Global Assessment of Functioning 30.  PLAN: The patient is admitted to [facility] behavior for close observation and additional measures. She will be started back on all her mental health medications and amoxicillin will be withheld.  During the stay in the hospital, she will begin milieu therapy and supportive counsel, and her mental status will be evaluated. The patient will probably be considered for discharge by Monday if she is clear mentally, because according to information obtained from the staff, she had  rambling speech and altered mental status at the time of admission.  ____________________________ Wallace Cullens. Franchot Mimes, MD skc:dm D: 08/30/2012 19:21:00 ET T: 08/30/2012 22:39:56 ET JOB#: [id number]  cc: Arlyn Leak K. Franchot Mimes, MD, <Dictator> Dewain Penning MD ELECTRONICALLY SIGNED 09/06/2012 15:25

## 2014-11-08 LAB — SURGICAL PATHOLOGY

## 2014-11-22 ENCOUNTER — Ambulatory Visit: Payer: BLUE CROSS/BLUE SHIELD | Admitting: General Surgery

## 2014-11-30 ENCOUNTER — Ambulatory Visit
Admission: RE | Admit: 2014-11-30 | Discharge: 2014-11-30 | Disposition: A | Discharge status: Home / Self Care | Payer: BLUE CROSS/BLUE SHIELD | Source: Ambulatory Visit | Attending: General Surgery | Admitting: General Surgery

## 2014-11-30 DIAGNOSIS — Z1231 Encounter for screening mammogram for malignant neoplasm of breast: Secondary | ICD-10-CM

## 2014-12-06 ENCOUNTER — Encounter: Payer: Self-pay | Admitting: General Surgery

## 2014-12-06 ENCOUNTER — Ambulatory Visit (INDEPENDENT_AMBULATORY_CARE_PROVIDER_SITE_OTHER): Payer: BLUE CROSS/BLUE SHIELD | Admitting: General Surgery

## 2014-12-06 VITALS — BP 138/78 | HR 76 | Resp 12 | Ht 63.0 in | Wt 155.0 lb

## 2014-12-06 DIAGNOSIS — R920 Mammographic microcalcification found on diagnostic imaging of breast: Secondary | ICD-10-CM

## 2014-12-06 DIAGNOSIS — T888XXD Other specified complications of surgical and medical care, not elsewhere classified, subsequent encounter: Secondary | ICD-10-CM

## 2014-12-06 NOTE — Patient Instructions (Addendum)
Patient to return to office as needed.

## 2014-12-06 NOTE — Progress Notes (Signed)
Patient ID: Krystal Smith, female   DOB: 03/13/1961, 54 y.o.   MRN: [id number]  Chief Complaint  Patient presents with  . Follow-up    mammogram    HPI Krystal Smith is a 54 y.o. female who presents for a breast evaluation. The most recent mammogram was done on 11/30/14 Patient does perform regular self breast checks and gets regular mammograms done.    HPI  Past Medical History  Diagnosis Date  . Asthma   . Anxiety   . Bipolar disorder   . IBS (irritable bowel syndrome)   . Infertility   . Depression   . Migraine   . Vitamin D deficiency   . Rosacea   . Fibroadenoma of breast   . BRCA negative 2013    [facility]    Past Surgical History  Procedure Laterality Date  . Tonsillectomy    . Wisdom tooth extraction    . Nasal sinus surgery  2004  . Pectus excavatum repair      age 86 at [facility]  . Breast cyst aspiration Left 2007    FNA negative for malignancy.  . Breast biopsy Right 2000    fibroadenoma, core biopsy.  . Breast cyst aspiration Right 2007  . Breast biopsy Left 06-21-14    stereotatic, fibrocystic changes with microcalcifications. BRCA testing neagative    Family History  Problem Relation Age of Onset  . Cancer Mother 40    breast  . Breast cancer Mother 41  . Cancer Maternal Aunt 50    breast  . Breast cancer Maternal Aunt 28  . Cancer Father     lymphoma    Social History History  Substance Use Topics  . Smoking status: Former Research scientist (life sciences)  . Smokeless tobacco: Never Used  . Alcohol Use: No    Allergies  Allergen Reactions  . Amitriptyline Other (See Comments)    Nervous ending pulse  . Amoxil [Amoxicillin]   . Erythromycin   . Imitrex [Sumatriptan]   . Skelaxin [Metaxalone]   . Ultracet [Tramadol-Acetaminophen] Itching    Current Outpatient Prescriptions  Medication Sig Dispense Refill  . albuterol (PROVENTIL) (2.5 MG/3ML) 0.083% nebulizer solution Take 2.5 mg by nebulization every 6 (six) hours as needed for wheezing  or shortness of breath.    . ALPRAZolam (XANAX) 0.5 MG tablet Take 0.5 mg by mouth 3 (three) times daily as needed.     . Aspirin-Acetaminophen-Caffeine (GOODY HEADACHE PO) Take by mouth as needed.    . budesonide-formoterol (SYMBICORT) 160-4.5 MCG/ACT inhaler Inhale 2 puffs into the lungs 2 (two) times daily.    Marland Kitchen buPROPion (WELLBUTRIN XL) 300 MG 24 hr tablet Take 300 mg by mouth daily.     Marland Kitchen lamoTRIgine (LAMICTAL) 200 MG tablet Take 200 mg by mouth daily.     Marland Kitchen MELATONIN PO Take 6 mg by mouth daily.     Marland Kitchen zolpidem (AMBIEN) 10 MG tablet Take 10 mg by mouth 2 (two) times daily.      No current facility-administered medications for this visit.    Review of Systems Review of Systems  Constitutional: Negative.   Respiratory: Negative.   Cardiovascular: Negative.     Blood pressure 138/78, pulse 76, resp. rate 12, height $RemoveBe'5\' 3"'JlOcPTRiJ$  (1.6 m), weight 155 lb (70.308 kg).  Physical Exam Physical Exam  Constitutional: She is oriented to person, place, and time. She appears well-developed and well-nourished.  Eyes: Conjunctivae are normal. No scleral icterus.  Neck: Neck supple.  Cardiovascular: Normal rate, regular  rhythm and normal heart sounds.   Pulmonary/Chest: Effort normal and breath sounds normal. Right breast exhibits no inverted nipple, no mass, no nipple discharge, no skin change and no tenderness. Left breast exhibits no inverted nipple, no mass, no nipple discharge, no skin change and no tenderness.    Left axilla hydradenitis   Lymphadenopathy:    She has no cervical adenopathy.  Neurological: She is alert and oriented to person, place, and time.  Skin: Skin is warm and dry.  Vitals reviewed.   Data Reviewed Bilateral screening mammograms dated 11/30/2014 were reviewed and compared to previous studies. Previous area of microcalcifications has been removed. Smoothly marginated well-defined 1 cm density corresponding to residual hematoma identified. BI-RADS-2.  Assessment     Benign breast exam status post biopsy for microcalcifications.  Focal area of hidradenitis left axilla.     Plan    Patient to return to office as needed.  The left axillary inflammatory process can be excised if the patient desires.  She should resume screening annual mammograms in spring 2017 with her GYN provider.     PCP:  Sol Passer 12/07/2014, 6:09 PM

## 2014-12-07 ENCOUNTER — Encounter: Payer: Self-pay | Admitting: General Surgery

## 2015-05-28 ENCOUNTER — Emergency Department: Payer: BLUE CROSS/BLUE SHIELD

## 2015-05-28 ENCOUNTER — Emergency Department
Admission: EM | Admit: 2015-05-28 | Discharge: 2015-05-28 | Disposition: A | Discharge status: Home / Self Care | Payer: BLUE CROSS/BLUE SHIELD | Attending: Emergency Medicine | Admitting: Emergency Medicine

## 2015-05-28 DIAGNOSIS — Z880 Allergy status to penicillin: Secondary | ICD-10-CM | POA: Insufficient documentation

## 2015-05-28 DIAGNOSIS — Z87891 Personal history of nicotine dependence: Secondary | ICD-10-CM | POA: Insufficient documentation

## 2015-05-28 DIAGNOSIS — S93601A Unspecified sprain of right foot, initial encounter: Secondary | ICD-10-CM | POA: Diagnosis not present

## 2015-05-28 DIAGNOSIS — Z79899 Other long term (current) drug therapy: Secondary | ICD-10-CM | POA: Insufficient documentation

## 2015-05-28 DIAGNOSIS — Y998 Other external cause status: Secondary | ICD-10-CM | POA: Insufficient documentation

## 2015-05-28 DIAGNOSIS — S9031XA Contusion of right foot, initial encounter: Secondary | ICD-10-CM | POA: Diagnosis not present

## 2015-05-28 DIAGNOSIS — Y92009 Unspecified place in unspecified non-institutional (private) residence as the place of occurrence of the external cause: Secondary | ICD-10-CM | POA: Diagnosis not present

## 2015-05-28 DIAGNOSIS — Z791 Long term (current) use of non-steroidal anti-inflammatories (NSAID): Secondary | ICD-10-CM | POA: Diagnosis not present

## 2015-05-28 DIAGNOSIS — W1843XA Slipping, tripping and stumbling without falling due to stepping from one level to another, initial encounter: Secondary | ICD-10-CM | POA: Insufficient documentation

## 2015-05-28 DIAGNOSIS — S93401A Sprain of unspecified ligament of right ankle, initial encounter: Secondary | ICD-10-CM | POA: Diagnosis not present

## 2015-05-28 DIAGNOSIS — G8911 Acute pain due to trauma: Secondary | ICD-10-CM

## 2015-05-28 DIAGNOSIS — Y9389 Activity, other specified: Secondary | ICD-10-CM | POA: Diagnosis not present

## 2015-05-28 DIAGNOSIS — S9001XA Contusion of right ankle, initial encounter: Secondary | ICD-10-CM | POA: Diagnosis not present

## 2015-05-28 DIAGNOSIS — S99911A Unspecified injury of right ankle, initial encounter: Secondary | ICD-10-CM | POA: Diagnosis present

## 2015-05-28 MED ORDER — HYDROCODONE-ACETAMINOPHEN 5-325 MG PO TABS: 1.0000 | ORAL_TABLET | ORAL | Status: DC | PRN

## 2015-05-28 MED ORDER — HYDROCODONE-ACETAMINOPHEN 5-325 MG PO TABS
1.0000 | ORAL_TABLET | Freq: Once | ORAL | Status: AC
  Administered 2015-05-28: 1 via ORAL
  Filled 2015-05-28: qty 1

## 2015-05-28 NOTE — ED Provider Notes (Signed)
[facility] Emergency Department Provider Note  ____________________________________________  Time seen: Approximately 7:45 PM  I have reviewed the triage vital signs and the nursing notes.   HISTORY  Chief Complaint Ankle Pain  HPI Krystal Smith is a 54 y.o. female is here with complaint of right ankle pain. Patient states her right foot and ankle are swollen after she missed a step and slipped at home today. Husband states that it swelled immediately. Patient has had difficulty bearing weight but is able to move digits without difficulty. She has not taken any over-the-counter medication for pain. She denies any previous foot or ankle fractures. She denies any head injury or loss of consciousness during this event. She rates her pain is 7 out of 10.Pain is constant and nonradiating in nature.   Past Medical History  Diagnosis Date  . Asthma   . Anxiety   . Bipolar disorder ([place])   . IBS (irritable bowel syndrome)   . Infertility   . Depression   . Migraine   . Vitamin D deficiency   . Rosacea   . Fibroadenoma of breast   . BRCA negative 2013    [facility]    Patient Active Problem List   Diagnosis Date Noted  . Hematoma complicating a procedure 06/22/2014  . Breast microcalcification, mammographic 11/26/2013    Past Surgical History  Procedure Laterality Date  . Tonsillectomy    . Wisdom tooth extraction    . Nasal sinus surgery  2004  . Pectus excavatum repair      age 59 at [facility]  . Breast cyst aspiration Left 2007    FNA negative for malignancy.  . Breast biopsy Right 2000    fibroadenoma, core biopsy.  . Breast cyst aspiration Right 2007  . Breast biopsy Left 06-21-14    stereotatic, fibrocystic changes with microcalcifications. BRCA testing neagative    Current Outpatient Rx  Name  Route  Sig  Dispense  Refill  . albuterol (PROVENTIL) (2.5 MG/3ML) 0.083% nebulizer solution   Nebulization   Take 2.5 mg by  nebulization every 6 (six) hours as needed for wheezing or shortness of breath.         . ALPRAZolam (XANAX) 0.5 MG tablet   Oral   Take 0.5 mg by mouth 3 (three) times daily as needed.          . Aspirin-Acetaminophen-Caffeine (GOODY HEADACHE PO)   Oral   Take by mouth as needed.         . budesonide-formoterol (SYMBICORT) 160-4.5 MCG/ACT inhaler   Inhalation   Inhale 2 puffs into the lungs 2 (two) times daily.         Marland Kitchen buPROPion (WELLBUTRIN XL) 300 MG 24 hr tablet   Oral   Take 300 mg by mouth daily.          Marland Kitchen HYDROcodone-acetaminophen (NORCO/VICODIN) 5-325 MG tablet   Oral   Take 1 tablet by mouth every 4 (four) hours as needed for moderate pain.   20 tablet   0   . lamoTRIgine (LAMICTAL) 200 MG tablet   Oral   Take 200 mg by mouth daily.          Marland Kitchen MELATONIN PO   Oral   Take 6 mg by mouth daily.          Marland Kitchen zolpidem (AMBIEN) 10 MG tablet   Oral   Take 10 mg by mouth 2 (two) times daily.  Allergies Amitriptyline; Amoxil; Depakote; Erythromycin; Imitrex; Skelaxin; and Ultracet  Family History  Problem Relation Age of Onset  . Cancer Mother 9    breast  . Breast cancer Mother 72  . Cancer Maternal Aunt 50    breast  . Breast cancer Maternal Aunt 41  . Cancer Father     lymphoma    Social History Social History  Substance Use Topics  . Smoking status: Former Research scientist (life sciences)  . Smokeless tobacco: Never Used  . Alcohol Use: No    Review of Systems Constitutional: No fever/chills Eyes: No visual changes. ENT: No trauma Cardiovascular: Denies chest pain. Respiratory: Denies shortness of breath. Gastrointestinal:  No nausea, no vomiting.   Musculoskeletal: Negative for back pain. Right ankle/foot pain as if Skin: Ecchymosis right foot. Neurological: Negative for headaches, focal weakness or numbness.  10-point ROS otherwise negative.  ____________________________________________   PHYSICAL EXAM:  VITAL SIGNS: ED Triage  Vitals  Enc Vitals Group     BP 05/28/15 1907 154/83 mmHg     Pulse Rate 05/28/15 1907 109     Resp 05/28/15 1907 18     Temp 05/28/15 1907 98.1 F (36.7 C)     Temp Source 05/28/15 1907 Oral     SpO2 05/28/15 1907 95 %     Weight 05/28/15 1907 153 lb (69.4 kg)     Height 05/28/15 1907 $RemoveBefor'5\' 3"'uUZqfpSPyzUR$  (1.6 m)     Head Cir --      Peak Flow --      Pain Score 05/28/15 1919 7     Pain Loc --      Pain Edu? --      Excl. in [place]? --     Constitutional: Alert and oriented. Well appearing and in no acute distress. Eyes: Conjunctivae are normal. PERRL. EOMI. Head: Atraumatic. Nose: No congestion/rhinnorhea. Neck: No stridor. Cardiovascular: Normal rate, regular rhythm. Grossly normal heart sounds.  Good peripheral circulation. Respiratory: Normal respiratory effort.  No retractions. Lungs CTAB. Gastrointestinal: Soft and nontender. No distention. Musculoskeletal: Right foot and ankle there is no gross deformity. There is moderate tenderness on palpation of the lateral aspect of the right foot and ankle. There is ecchymosis and soft tissue swelling present in the same area. Digits distally motor sensory function intact. Capillary refill less than 3 seconds. Neurologic:  Normal speech and language. No gross focal neurologic deficits are appreciated. Gait was not tested secondary to patient's pain.  Skin:  Skin is warm, dry and intact. Ecchymosis was present on the lateral aspect of the right ankle and foot. No abrasions were noted. Psychiatric: Mood and affect are normal. Speech and behavior are normal.  ____________________________________________   LABS (all labs ordered are listed, but only abnormal results are displayed)  Labs Reviewed - No data to display RADIOLOGY  X-ray was negative for fracture per radiologist. Leana Gamer, personally viewed and evaluated these images (plain radiographs) as part of my medical decision making.   ____________________________________________   PROCEDURES  Procedure(s) performed: None  Critical Care performed: No  ____________________________________________   INITIAL IMPRESSION / ASSESSMENT AND PLAN / ED COURSE  Pertinent labs & imaging results that were available during my care of the patient were reviewed by me and considered in my medical decision making (see chart for details).  patient was placed in a Jones wrap. Ice and elevation was encouraged. Patient was given a prescription for Norco as needed for pain. She is follow-up with orthopedist if any continued problems or any  concerns.  ____________________________________________   FINAL CLINICAL IMPRESSION(S) / ED DIAGNOSES  Final diagnoses:  Sprain of right foot, initial encounter  Sprain of right ankle, initial encounter      Johnn Hai, PA-C 05/28/15 2037  Earleen Newport, MD 05/28/15 [phone]

## 2015-05-28 NOTE — ED Notes (Signed)
Pt presents with right ankle swelling. Bruising noted to top of right foot. Pt demonstrates movement of toes without difficulty.

## 2015-05-28 NOTE — ED Notes (Signed)
Pt states she slipped going down the step today and twisted her right ankle

## 2015-05-28 NOTE — Discharge Instructions (Signed)
Ice and elevate foot and ankle as needed for pain and swelling. Use crutches when walking. Norco as needed for pain. Follow-up with orthopedist if any continued problems or severe worsening of your symptoms return to the emergency room.

## 2015-06-17 ENCOUNTER — Other Ambulatory Visit: Payer: Self-pay | Admitting: Otolaryngology

## 2015-06-17 DIAGNOSIS — H9072 Mixed conductive and sensorineural hearing loss, unilateral, left ear, with unrestricted hearing on the contralateral side: Secondary | ICD-10-CM

## 2015-07-08 ENCOUNTER — Ambulatory Visit
Admission: RE | Admit: 2015-07-08 | Discharge: 2015-07-08 | Disposition: A | Discharge status: Home / Self Care | Payer: BLUE CROSS/BLUE SHIELD | Source: Ambulatory Visit | Attending: Otolaryngology | Admitting: Otolaryngology

## 2015-07-08 DIAGNOSIS — H9072 Mixed conductive and sensorineural hearing loss, unilateral, left ear, with unrestricted hearing on the contralateral side: Secondary | ICD-10-CM

## 2015-07-08 MED ORDER — GADOBENATE DIMEGLUMINE 529 MG/ML IV SOLN
15.0000 mL | Freq: Once | INTRAVENOUS | Status: AC | PRN
  Administered 2015-07-08: 14 mL via INTRAVENOUS

## 2015-10-14 ENCOUNTER — Emergency Department
Admission: EM | Admit: 2015-10-14 | Discharge: 2015-10-14 | Disposition: A | Discharge status: Home / Self Care | Payer: BLUE CROSS/BLUE SHIELD | Attending: Emergency Medicine | Admitting: Emergency Medicine

## 2015-10-14 ENCOUNTER — Encounter: Payer: Self-pay | Admitting: Emergency Medicine

## 2015-10-14 DIAGNOSIS — Z87891 Personal history of nicotine dependence: Secondary | ICD-10-CM | POA: Diagnosis not present

## 2015-10-14 DIAGNOSIS — Z7951 Long term (current) use of inhaled steroids: Secondary | ICD-10-CM | POA: Insufficient documentation

## 2015-10-14 DIAGNOSIS — Z79899 Other long term (current) drug therapy: Secondary | ICD-10-CM | POA: Diagnosis not present

## 2015-10-14 DIAGNOSIS — F319 Bipolar disorder, unspecified: Secondary | ICD-10-CM | POA: Diagnosis not present

## 2015-10-14 DIAGNOSIS — J45909 Unspecified asthma, uncomplicated: Secondary | ICD-10-CM | POA: Diagnosis not present

## 2015-10-14 DIAGNOSIS — G43801 Other migraine, not intractable, with status migrainosus: Secondary | ICD-10-CM

## 2015-10-14 DIAGNOSIS — Z7982 Long term (current) use of aspirin: Secondary | ICD-10-CM | POA: Diagnosis not present

## 2015-10-14 DIAGNOSIS — R51 Headache: Secondary | ICD-10-CM | POA: Diagnosis present

## 2015-10-14 MED ORDER — KETOROLAC TROMETHAMINE 30 MG/ML IJ SOLN
30.0000 mg | Freq: Once | INTRAMUSCULAR | Status: AC
  Administered 2015-10-14: 30 mg via INTRAVENOUS
  Filled 2015-10-14: qty 1

## 2015-10-14 MED ORDER — MAGNESIUM SULFATE 2 GM/50ML IV SOLN
2.0000 g | Freq: Once | INTRAVENOUS | Status: AC
  Administered 2015-10-14: 2 g via INTRAVENOUS
  Filled 2015-10-14 (×2): qty 50

## 2015-10-14 MED ORDER — BUTALBITAL-APAP-CAFFEINE 50-325-40 MG PO TABS: 1.0000 | ORAL_TABLET | Freq: Four times a day (QID) | ORAL | Status: AC | PRN

## 2015-10-14 MED ORDER — HYDROMORPHONE HCL 1 MG/ML IJ SOLN
1.0000 mg | Freq: Once | INTRAMUSCULAR | Status: AC
  Administered 2015-10-14: 1 mg via INTRAVENOUS
  Filled 2015-10-14: qty 1

## 2015-10-14 MED ORDER — SODIUM CHLORIDE 0.9 % IV SOLN
1000.0000 mL | Freq: Once | INTRAVENOUS | Status: AC
  Administered 2015-10-14: 1000 mL via INTRAVENOUS

## 2015-10-14 MED ORDER — HYDROMORPHONE HCL 1 MG/ML IJ SOLN
INTRAMUSCULAR | Status: AC
  Administered 2015-10-14: 1 mg via INTRAVENOUS
  Filled 2015-10-14: qty 1

## 2015-10-14 MED ORDER — HYDROMORPHONE HCL 1 MG/ML IJ SOLN
1.0000 mg | Freq: Once | INTRAMUSCULAR | Status: AC
  Administered 2015-10-14: 1 mg via INTRAVENOUS

## 2015-10-14 NOTE — ED Notes (Signed)
Reports ha x2wks.  Hx of migraines, states it feels the same

## 2015-10-14 NOTE — ED Provider Notes (Signed)
[facility] Emergency Department Provider Note  ____________________________________________    I have reviewed the triage vital signs and the nursing notes.   HISTORY  Chief Complaint Headache    HPI Krystal Smith is a 55 y.o. female presents with a headache. Patient reports this is her typical migraine. She notes it is been occurring for approximately 2 weeks but has become worse today. She denies neuro deficits. No fevers or chills. No neck pain. She reports Demerol, Phenergan and Toradol is what she needs     Past Medical History  Diagnosis Date  . Asthma   . Anxiety   . Bipolar disorder ([place])   . IBS (irritable bowel syndrome)   . Infertility   . Depression   . Migraine   . Vitamin D deficiency   . Rosacea   . Fibroadenoma of breast   . BRCA negative 2013    [facility]    Patient Active Problem List   Diagnosis Date Noted  . Hematoma complicating a procedure 06/22/2014  . Breast microcalcification, mammographic 11/26/2013    Past Surgical History  Procedure Laterality Date  . Tonsillectomy    . Wisdom tooth extraction    . Nasal sinus surgery  2004  . Pectus excavatum repair      age 83 at [facility]  . Breast cyst aspiration Left 2007    FNA negative for malignancy.  . Breast biopsy Right 2000    fibroadenoma, core biopsy.  . Breast cyst aspiration Right 2007  . Breast biopsy Left 06-21-14    stereotatic, fibrocystic changes with microcalcifications. BRCA testing neagative    Current Outpatient Rx  Name  Route  Sig  Dispense  Refill  . albuterol (PROVENTIL) (2.5 MG/3ML) 0.083% nebulizer solution   Nebulization   Take 2.5 mg by nebulization every 6 (six) hours as needed for wheezing or shortness of breath.         . ALPRAZolam (XANAX) 0.5 MG tablet   Oral   Take 0.5 mg by mouth 3 (three) times daily as needed.          . Aspirin-Acetaminophen-Caffeine (GOODY HEADACHE PO)   Oral   Take by mouth as needed.          . budesonide-formoterol (SYMBICORT) 160-4.5 MCG/ACT inhaler   Inhalation   Inhale 2 puffs into the lungs 2 (two) times daily.         Marland Kitchen buPROPion (WELLBUTRIN XL) 300 MG 24 hr tablet   Oral   Take 300 mg by mouth daily.          . butalbital-acetaminophen-caffeine (FIORICET) 50-325-40 MG tablet   Oral   Take 1-2 tablets by mouth every 6 (six) hours as needed for headache.   20 tablet   0   . HYDROcodone-acetaminophen (NORCO/VICODIN) 5-325 MG tablet   Oral   Take 1 tablet by mouth every 4 (four) hours as needed for moderate pain.   20 tablet   0   . lamoTRIgine (LAMICTAL) 200 MG tablet   Oral   Take 200 mg by mouth daily.          Marland Kitchen MELATONIN PO   Oral   Take 6 mg by mouth daily.          Marland Kitchen zolpidem (AMBIEN) 10 MG tablet   Oral   Take 10 mg by mouth 2 (two) times daily.            Allergies Amitriptyline; Amoxil; Depakote; Erythromycin; Imitrex; Skelaxin; and  Ultracet  Family History  Problem Relation Age of Onset  . Cancer Mother 85    breast  . Breast cancer Mother 67  . Cancer Maternal Aunt 50    breast  . Breast cancer Maternal Aunt 69  . Cancer Father     lymphoma    Social History Social History  Substance Use Topics  . Smoking status: Former Research scientist (life sciences)  . Smokeless tobacco: Never Used  . Alcohol Use: No    Review of Systems  Constitutional: Negative for fever. Eyes: Negative for blurry vision ENT: Negative for neck pain Cardiovascular: Negative for chest pain Respiratory: Negative for shortness of breath. Gastrointestinal: Negative for nausea  Musculoskeletal: Negative for back pain. Skin: Negative for rash. Neurological: Negative for focal weakness Psychiatric: no anxiety    ____________________________________________   PHYSICAL EXAM:  VITAL SIGNS: ED Triage Vitals  Enc Vitals Group     BP 10/14/15 1354 134/75 mmHg     Pulse Rate 10/14/15 1354 80     Resp 10/14/15 1354 18     Temp 10/14/15 1354 98.5 F (36.9 C)      Temp Source 10/14/15 1354 Oral     SpO2 10/14/15 1354 94 %     Weight 10/14/15 1354 170 lb 2 oz (77.168 kg)     Height 10/14/15 1354 '5\' 3"'$  (1.6 m)     Head Cir --      Peak Flow --      Pain Score 10/14/15 1355 10     Pain Loc --      Pain Edu? --      Excl. in [place]? --      Constitutional: Alert and oriented. Well appearing and in no distress.  Eyes: Conjunctivae are normal. No erythema or injection ENT   Head: Normocephalic and atraumatic.   Mouth/Throat: Mucous membranes are moist. Cardiovascular: Normal rate, regular rhythm.  Respiratory: Normal respiratory effort without tachypnea nor retractions.  Genitourinary: deferred Musculoskeletal: Nontender with normal range of motion in all extremities.  Neurologic:  Normal speech and language. No gross focal neurologic deficits are appreciated. Skin:  Skin is warm, dry and intact. No rash noted. Psychiatric: Mood and affect are normal. Patient exhibits appropriate insight and judgment.  ____________________________________________    LABS (pertinent positives/negatives)  Labs Reviewed - No data to display  ____________________________________________   EKG  None  ____________________________________________    RADIOLOGY  None  ____________________________________________   PROCEDURES  Procedure(s) performed: none  Critical Care performed: none  ____________________________________________   INITIAL IMPRESSION / ASSESSMENT AND PLAN / ED COURSE  Pertinent labs & imaging results that were available during my care of the patient were reviewed by me and considered in my medical decision making (see chart for details).  I told patient that we do not have Demerol and that is not used any more which made her quite upset. We also have a Associate Professor. I ordered Toradol and Dilaudid. This did not help her pain. We gave IV magnesium which did seem to help somewhat. She then experienced relief  after another milligram of Dilaudid.  ____________________________________________   FINAL CLINICAL IMPRESSION(S) / ED DIAGNOSES  Final diagnoses:  Other migraine with status migrainosus, not intractable          Lavonia Drafts, MD 10/14/15 2309

## 2015-10-14 NOTE — Discharge Instructions (Signed)

## 2016-01-19 ENCOUNTER — Other Ambulatory Visit: Payer: Self-pay | Admitting: General Surgery

## 2016-01-19 ENCOUNTER — Other Ambulatory Visit: Payer: Self-pay | Admitting: Certified Nurse Midwife

## 2016-01-19 DIAGNOSIS — Z1231 Encounter for screening mammogram for malignant neoplasm of breast: Secondary | ICD-10-CM

## 2016-02-08 ENCOUNTER — Ambulatory Visit: Payer: BLUE CROSS/BLUE SHIELD | Attending: General Surgery

## 2016-02-24 ENCOUNTER — Ambulatory Visit
Admission: RE | Admit: 2016-02-24 | Discharge: 2016-02-24 | Disposition: A | Discharge status: Home / Self Care | Payer: BLUE CROSS/BLUE SHIELD | Source: Ambulatory Visit | Attending: General Surgery | Admitting: General Surgery

## 2016-02-24 ENCOUNTER — Other Ambulatory Visit: Payer: Self-pay | Admitting: General Surgery

## 2016-02-24 DIAGNOSIS — Z1231 Encounter for screening mammogram for malignant neoplasm of breast: Secondary | ICD-10-CM | POA: Diagnosis present

## 2018-02-05 ENCOUNTER — Encounter: Payer: Self-pay | Admitting: Certified Nurse Midwife

## 2018-02-05 ENCOUNTER — Ambulatory Visit (INDEPENDENT_AMBULATORY_CARE_PROVIDER_SITE_OTHER): Payer: BLUE CROSS/BLUE SHIELD | Admitting: Certified Nurse Midwife

## 2018-02-05 ENCOUNTER — Other Ambulatory Visit (HOSPITAL_COMMUNITY)
Admission: RE | Admit: 2018-02-05 | Discharge: 2018-02-05 | Disposition: A | Discharge status: Home / Self Care | Payer: BLUE CROSS/BLUE SHIELD | Source: Ambulatory Visit | Attending: Obstetrics and Gynecology | Admitting: Obstetrics and Gynecology

## 2018-02-05 VITALS — BP 130/80 | HR 72 | Ht 62.5 in | Wt 172.0 lb

## 2018-02-05 DIAGNOSIS — Z1239 Encounter for other screening for malignant neoplasm of breast: Secondary | ICD-10-CM

## 2018-02-05 DIAGNOSIS — R7309 Other abnormal glucose: Secondary | ICD-10-CM

## 2018-02-05 DIAGNOSIS — Z8349 Family history of other endocrine, nutritional and metabolic diseases: Secondary | ICD-10-CM

## 2018-02-05 DIAGNOSIS — Z01411 Encounter for gynecological examination (general) (routine) with abnormal findings: Secondary | ICD-10-CM

## 2018-02-05 DIAGNOSIS — R232 Flushing: Secondary | ICD-10-CM | POA: Diagnosis not present

## 2018-02-05 DIAGNOSIS — R6882 Decreased libido: Secondary | ICD-10-CM

## 2018-02-05 DIAGNOSIS — Z1231 Encounter for screening mammogram for malignant neoplasm of breast: Secondary | ICD-10-CM

## 2018-02-05 DIAGNOSIS — Z01419 Encounter for gynecological examination (general) (routine) without abnormal findings: Secondary | ICD-10-CM

## 2018-02-05 DIAGNOSIS — E785 Hyperlipidemia, unspecified: Secondary | ICD-10-CM

## 2018-02-05 DIAGNOSIS — N941 Unspecified dyspareunia: Secondary | ICD-10-CM

## 2018-02-05 DIAGNOSIS — N898 Other specified noninflammatory disorders of vagina: Secondary | ICD-10-CM | POA: Diagnosis not present

## 2018-02-05 DIAGNOSIS — B373 Candidiasis of vulva and vagina: Secondary | ICD-10-CM

## 2018-02-05 DIAGNOSIS — Z124 Encounter for screening for malignant neoplasm of cervix: Secondary | ICD-10-CM

## 2018-02-05 DIAGNOSIS — B3731 Acute candidiasis of vulva and vagina: Secondary | ICD-10-CM

## 2018-02-05 MED ORDER — TERCONAZOLE 0.4 % VA CREA
1.0000 | TOPICAL_CREAM | Freq: Every day | VAGINAL | 0 refills | Status: AC
Start: 2018-02-05 — End: 2018-02-12

## 2018-02-05 NOTE — Progress Notes (Addendum)
Gynecology Annual Exam  PCP: Kirk Ruths, MD  Chief Complaint:  Chief Complaint  Patient presents with  . Gynecologic Exam    sex and hurts and burns and my thyroid.    History of Present Illness:Krystal Smith presents today for her annual exam. She is a 57 year old Caucasian/White female , G 0 P 0 0 0 0 , whose LMP was January 2015 (spotting x1day) . She reports having vaginal soreness with IC. She also complains of continued problems with decreased libido. She has hot flashes. No spotting. Has declined treatment with estrogen due to family history of breast cancer in her mother and maternal aunt. Has tried testosterone gel for libido, but was not effective. At one point she was taking Wellbutrin for her depression, but that did not change her libido The patient's past medical history is notable for bipolar disorder and currently takes Lamictal and sertraline. . She also has a hx of migraines, allergies, fibrocystic breasts, rosecea,and mild intermittent asthma..  Since her last exam 05/24/2016, she reports that her mother was diagnosed with a thyroid nodule. THe patient would like thyroid tests done. She also reports recently being treated for pneumonia.    Her most recent pap smear was obtained 05/24/2016  and was NIL. Her most recent mammogram obtained on 02/24/2016 and was negative. She had a stereotactic biopsy in December 2015 on the left breast that was benign. She also had right breast biopsies x 2 that were benign.  There is a positive history of breast cancer in her mother and maternal aunt. Genetic testing has been done. The patient tested negative for BRCA 1 and 2. She has a lifetime risk of breast cancer of 26%. There is no family history of ovarian cancer. The patient does do monthly self breast exams.  She denies a recent screening colonoscopy and is eligible.  A DEXA scan is not applicable for this patient.  The patient does not smoke.  The patient does  drink occasionally.  The patient does not use illegal drugs.  The patient has not been exercising  The patient does get adequate calcium in her diet.  She had a recent cholesterol screen in 2016 that was borderline    Review of Systems: Review of Systems  Constitutional: Negative for chills, fever and weight loss.  HENT: Negative for congestion, sinus pain and sore throat.   Eyes: Negative for blurred vision and pain.  Respiratory: Negative for hemoptysis, shortness of breath and wheezing.   Cardiovascular: Negative for chest pain, palpitations and leg swelling.  Gastrointestinal: Negative for abdominal pain, blood in stool, diarrhea, heartburn, nausea and vomiting.  Genitourinary: Negative for dysuria, frequency, hematuria and urgency.       Positive for dyspareunia and decreased libido.  Musculoskeletal: Positive for joint pain. Negative for back pain and myalgias.  Skin: Negative for itching and rash.  Neurological: Positive for headaches. Negative for dizziness and tingling.  Endo/Heme/Allergies: Negative for environmental allergies and polydipsia. Bruises/bleeds easily.       Negative for hirsutism; positive for hot flashes   Psychiatric/Behavioral: Negative for depression. The patient is not nervous/anxious and does not have insomnia.   Breasts: tenderness in left upper outer breast.  Past Medical History:  Past Medical History:  Diagnosis Date  . Allergic rhinitis   . Anxiety   . Asthma   . Bacterial pneumonia 12/2008   HOSP X2D  . Bipolar disorder ([place])   . BRCA negative 2013  [facility]; IBIS-26%  . Depression   . Family history of breast cancer    mother age 43, mat aunt-two primary br cas in either breast-first cancer age 59, mgac breast and pancreatic  . Fibroadenoma of breast   . IBS (irritable bowel syndrome)   . Infertility   . Migraine   . Rosacea   . Vitamin D deficiency     Past Surgical History:  Past Surgical History:  Procedure Laterality  Date  . BREAST BIOPSY Right 2000   fibroadenoma, core biopsy.  Marland Kitchen BREAST BIOPSY Left 06-21-14   stereotatic, fibrocystic changes with microcalcifications. BRCA testing neagative  . BREAST CYST ASPIRATION Left 04/2006  . BREAST CYST ASPIRATION Right 2007  . NASAL SINUS SURGERY  2004  . PECTUS EXCAVATUM REPAIR     age 83 at [facility]  . Repair left ankle fracture    . TONSILLECTOMY    . WISDOM TOOTH EXTRACTION      Family History:  Family History  Problem Relation Age of Onset  . Breast cancer Mother 2  . Hypertension Mother   . Thyroid disease Mother   . Osteoporosis Mother   . Cancer Father        lymphoma; Squamous cell of face  . Dementia Father   . Heart disease Father        MI AND CABG  . Breast cancer Maternal Aunt 50       Had recurrence in second breast  . Breast cancer Other   . Pancreatic cancer Other     Social History:  Social History   Socioeconomic History  . Marital status: Married    Spouse name: Not on file  . Number of children: 1  . Years of education: 41  . Highest education level: Not on file  Occupational History  . Occupation: HOMEMAKER  Social Needs  . Financial resource strain: Not on file  . Food insecurity:    Worry: Not on file    Inability: Not on file  . Transportation needs:    Medical: Not on file    Non-medical: Not on file  Tobacco Use  . Smoking status: Former Research scientist (life sciences)  . Smokeless tobacco: Never Used  . Tobacco comment: QUIT 1994  Substance and Sexual Activity  . Alcohol use: Yes    Alcohol/week: 0.0 oz    Comment: 0-1 BEER  . Drug use: No  . Sexual activity: Yes    Partners: Male    Birth control/protection: Post-menopausal  Lifestyle  . Physical activity:    Days per week: Not on file    Minutes per session: Not on file  . Stress: Not on file  Relationships  . Social connections:    Talks on phone: Not on file    Gets together: Not on file    Attends religious service: Not on file    Active member of club or  organization: Not on file    Attends meetings of clubs or organizations: Not on file    Relationship status: Not on file  . Intimate partner violence:    Fear of current or ex partner: Not on file    Emotionally abused: Not on file    Physically abused: Not on file    Forced sexual activity: Not on file  Other Topics Concern  . Not on file  Social History Narrative  . Not on file    Allergies:  Allergies  Allergen Reactions  . Amitriptyline Other (See Comments)  Nervous ending pulse  . Amoxil [Amoxicillin]   . Corn-Containing Products     And other foods - diarrhea  . Depakote [Divalproex Sodium] Other (See Comments)    Uncontrolled muscle movement  . Erythromycin   . Imitrex [Sumatriptan]   . Skelaxin [Metaxalone]   . Ultracet [Tramadol-Acetaminophen] Itching  . Tetracyclines & Related Rash  . Venlafaxine Other (See Comments)    Night sweats.    Medications: Current Outpatient Medications on File Prior to Visit  Medication Sig Dispense Refill  . albuterol (PROVENTIL) (2.5 MG/3ML) 0.083% nebulizer solution Take 2.5 mg by nebulization every 6 (six) hours as needed for wheezing or shortness of breath.    . ALPRAZolam (XANAX) 0.5 MG tablet Take 0.5 mg by mouth 3 (three) times daily as needed.     Marland Kitchen aspirin-acetaminophen-caffeine (EXCEDRIN MIGRAINE) 250-250-65 MG tablet Take 1 tablet by mouth every 6 (six) hours as needed for headache.    . Aspirin-Acetaminophen-Caffeine (GOODY HEADACHE PO) Take by mouth as needed.    . Ivermectin (SOOLANTRA) 1 % CREA Apply 1 application topically daily.    Marland Kitchen lamoTRIgine (LAMICTAL) 200 MG tablet Take 200 mg by mouth daily.     . Melatonin 3 MG TABS Take 12 mg by mouth at bedtime.    . naproxen (NAPROSYN) 500 MG tablet Take 1 tablet by mouth as needed.    . sertraline (ZOLOFT) 50 MG tablet Take 1 tablet by mouth daily.    . traZODone (DESYREL) 100 MG tablet Take 1 tablet by mouth as needed.    . valACYclovir (VALTREX) 1000 MG tablet Take 1  tablet by mouth as needed.     No current facility-administered medications on file prior to visit.    Physical Exam Vitals: BP 130/80   Pulse 72   Ht 5' 2.5" (1.588 m)   Wt 172 lb (78 kg)   BMI 30.96 kg/m   General: WF in NAD HEENT: normocephalic, anicteric Neck: no thyroid enlargement, no palpable nodules, no cervical lymphadenopathy  Pulmonary: No increased work of breathing, CTAB Cardiovascular: RRR, without murmur  Breast: Breast symmetrical, no tenderness, no palpable nodules or masses, no skin or nipple retraction present, no nipple discharge.  No axillary, infraclavicular or supraclavicular lymphadenopathy. Abdomen: Soft, non-tender, non-distended.  Umbilicus without lesions.  No hepatomegaly or masses palpable. No evidence of hernia. Genitourinary:  External: Normal external female genitalia.  Normal urethral meatus, normal  Bartholin's and Skene's glands.    Vagina: Normal vaginal mucosa, no evidence of prolapse. White cottage cheese discharge    Cervix: Grossly normal in appearance, non-tender, stenotic  Uterus: MP, small,  mobile, and non-tender  Adnexa: No adnexal masses, non-tender  Rectal: deferred  Lymphatic: no evidence of inguinal lymphadenopathy Extremities: no edema, erythema, or tenderness Neurologic: Grossly intact Psychiatric: mood appropriate, affect full  Wet Prep: positive for hyphae and negative for clue cells and Trichimonas   Assessment: 57 y.o. G0P0 well woman exam Dyspareunia and decreased libido FAmily history of breast cancer an increased risk of breast cancer Monilial vaginitis  Terconazole 7 cream   Plan:    1) Breast cancer screening - recommend monthly self breast exam and annual screening mammograms. Mammogram was ordered today. BRCA negative. Qualifies for [place] update  2) Colon cancer screening: discussed options and patient would like to do the Cologuard. This was ordered.  3) Cervical cancer screening - Pap was done. ASCCP  guidelines and rational discussed.  Patient opts for yearly screening interval  4) DYspareunia- patient declines topical estrogen.  Discuused use of Intrarosa and patient wishes to trial of Intrarosa. Samples given with instructions for use. To call me if desires RX. Will also call with the names of some clitoral stimulation gels which may help with her arousal disorders.    5) Routine healthcare maintenance including cholesterol and diabetes screening ordered today-along [place], CNM

## 2018-02-06 LAB — HGB A1C W/O EAG: Hgb A1c MFr Bld: 5.8 % — ABNORMAL HIGH (ref 4.8–5.6)

## 2018-02-06 LAB — LIPID PANEL WITH LDL/HDL RATIO
Cholesterol, Total: 221 mg/dL — ABNORMAL HIGH (ref 100–199)
HDL: 67 mg/dL (ref >39)
LDL Calculated: 120 mg/dL — ABNORMAL HIGH (ref 0–99)
LDL/HDL RATIO: 1.8 ratio (ref 0.0–3.2)
Triglycerides: 170 mg/dL — ABNORMAL HIGH (ref 0–149)
VLDL CHOLESTEROL CAL: 34 mg/dL (ref 5–40)

## 2018-02-06 LAB — TSH: TSH: 3 u[IU]/mL (ref 0.450–4.500)

## 2018-02-07 LAB — CYTOLOGY - PAP: DIAGNOSIS: NEGATIVE

## 2018-02-13 DIAGNOSIS — Z9189 Other specified personal risk factors, not elsewhere classified: Secondary | ICD-10-CM

## 2018-02-13 HISTORY — DX: Other specified personal risk factors, not elsewhere classified: Z91.89

## 2018-02-16 ENCOUNTER — Encounter: Payer: Self-pay | Admitting: Certified Nurse Midwife

## 2018-02-20 ENCOUNTER — Encounter: Payer: Self-pay | Admitting: Certified Nurse Midwife

## 2018-02-20 DIAGNOSIS — F319 Bipolar disorder, unspecified: Secondary | ICD-10-CM | POA: Insufficient documentation

## 2018-02-20 DIAGNOSIS — J45909 Unspecified asthma, uncomplicated: Secondary | ICD-10-CM | POA: Insufficient documentation

## 2018-02-20 DIAGNOSIS — F329 Major depressive disorder, single episode, unspecified: Secondary | ICD-10-CM | POA: Insufficient documentation

## 2018-02-20 DIAGNOSIS — J309 Allergic rhinitis, unspecified: Secondary | ICD-10-CM | POA: Insufficient documentation

## 2018-02-20 DIAGNOSIS — Z803 Family history of malignant neoplasm of breast: Secondary | ICD-10-CM | POA: Insufficient documentation

## 2018-02-20 DIAGNOSIS — K589 Irritable bowel syndrome without diarrhea: Secondary | ICD-10-CM | POA: Insufficient documentation

## 2018-02-20 DIAGNOSIS — F32A Depression, unspecified: Secondary | ICD-10-CM | POA: Insufficient documentation

## 2018-02-20 DIAGNOSIS — F419 Anxiety disorder, unspecified: Secondary | ICD-10-CM | POA: Insufficient documentation

## 2018-02-20 DIAGNOSIS — L719 Rosacea, unspecified: Secondary | ICD-10-CM | POA: Insufficient documentation

## 2018-02-20 DIAGNOSIS — G43909 Migraine, unspecified, not intractable, without status migrainosus: Secondary | ICD-10-CM | POA: Insufficient documentation

## 2018-02-20 DIAGNOSIS — E559 Vitamin D deficiency, unspecified: Secondary | ICD-10-CM | POA: Insufficient documentation

## 2018-02-20 LAB — POCT WET PREP (WET MOUNT): Trichomonas Wet Prep HPF POC: ABSENT

## 2018-02-21 ENCOUNTER — Encounter: Payer: Self-pay | Admitting: Certified Nurse Midwife

## 2018-02-21 ENCOUNTER — Ambulatory Visit (INDEPENDENT_AMBULATORY_CARE_PROVIDER_SITE_OTHER): Payer: BLUE CROSS/BLUE SHIELD | Admitting: Certified Nurse Midwife

## 2018-02-21 VITALS — BP 130/60 | HR 93 | Ht 62.5 in | Wt 172.0 lb

## 2018-02-21 DIAGNOSIS — N6312 Unspecified lump in the right breast, upper inner quadrant: Secondary | ICD-10-CM | POA: Diagnosis not present

## 2018-02-21 DIAGNOSIS — Z1231 Encounter for screening mammogram for malignant neoplasm of breast: Secondary | ICD-10-CM

## 2018-02-21 DIAGNOSIS — Z803 Family history of malignant neoplasm of breast: Secondary | ICD-10-CM

## 2018-02-21 DIAGNOSIS — N6310 Unspecified lump in the right breast, unspecified quadrant: Secondary | ICD-10-CM

## 2018-02-21 DIAGNOSIS — Z1239 Encounter for other screening for malignant neoplasm of breast: Secondary | ICD-10-CM

## 2018-02-21 NOTE — Progress Notes (Signed)
Obstetrics & Gynecology Office Visit   Chief Complaint:  Chief Complaint  Patient presents with  . Breast Problem    Lump felt in Right breast    History of Present Illness: 57 year old postmenopausal  WF presents with complaints of feeling a lump in her right breast that has become tender to palpation over the last 2 weeks. The lump is pea sized and near the nipple. Was just seen 7/24 for her annual gyn exam and had a normal breast exam.   Her most recent mammogram obtained was on 02/24/2016 and was negative. She has not scheduled her screening mammogram this year. She had a stereotactic biopsy in December 2015 on the left breast that was benign. She also had right breast biopsy in 2000 that was a fibroadenoma.  She has also had breast cysts aspirated on both breasts in 2007. There is a positive history of breast cancer in her mother, maternal aunt and maternal great aunt. Genetic testing has been done. The patient tested negative for BRCA 1 and 2. She has a lifetime risk of breast cancer of 26%. There is no family history of ovarian cancer. The patient does do monthly self breast exams.    Review of Systems:  ROS -see HPI  Past Medical History:  Past Medical History:  Diagnosis Date  . Allergic rhinitis   . Anxiety   . Asthma   . Bacterial pneumonia 12/2008   HOSP X2D  . Bipolar disorder ([place])   . BRCA negative 2013   [facility]; IBIS-26%  . Depression   . Family history of breast cancer    mother age 24, mat aunt-two primary br cas in either breast-first cancer age 90, mgac breast and pancreatic  . Fibroadenoma of breast   . IBS (irritable bowel syndrome)   . Infertility   . Migraine   . Rosacea   . Vitamin D deficiency     Past Surgical History:  Past Surgical History:  Procedure Laterality Date  . BREAST BIOPSY Right 2000   fibroadenoma, core biopsy.  Marland Kitchen BREAST BIOPSY Left 06-21-14   stereotatic, fibrocystic changes with microcalcifications. BRCA testing  neagative  . BREAST CYST ASPIRATION Left 04/2006  . BREAST CYST ASPIRATION Right 2007  . NASAL SINUS SURGERY  2004  . PECTUS EXCAVATUM REPAIR     age 57 at [facility]  . Repair left ankle fracture    . TONSILLECTOMY    . WISDOM TOOTH EXTRACTION      Gynecologic History: No LMP recorded. Patient is perimenopausal.  Obstetric History: G0P0  Family History:  Family History  Problem Relation Age of Onset  . Breast cancer Mother 46  . Hypertension Mother   . Thyroid disease Mother   . Osteoporosis Mother   . Cancer Father        lymphoma; Squamous cell of face  . Dementia Father   . Heart disease Father        MI AND CABG  . Breast cancer Maternal Aunt 50       Had recurrence in second breast  . Breast cancer Other   . Pancreatic cancer Other     Social History:  Social History   Socioeconomic History  . Marital status: Married    Spouse name: Not on file  . Number of children: 1  . Years of education: 30  . Highest education level: Not on file  Occupational History  . Occupation: HOMEMAKER  Social Needs  . Emergency planning/management officer  strain: Not on file  . Food insecurity:    Worry: Not on file    Inability: Not on file  . Transportation needs:    Medical: Not on file    Non-medical: Not on file  Tobacco Use  . Smoking status: Former Research scientist (life sciences)  . Smokeless tobacco: Never Used  . Tobacco comment: QUIT 1994  Substance and Sexual Activity  . Alcohol use: Yes    Alcohol/week: 0.0 standard drinks    Comment: 0-1 BEER  . Drug use: No  . Sexual activity: Yes    Partners: Male    Birth control/protection: Post-menopausal  Lifestyle  . Physical activity:    Days per week: Not on file    Minutes per session: Not on file  . Stress: Not on file  Relationships  . Social connections:    Talks on phone: Not on file    Gets together: Not on file    Attends religious service: Not on file    Active member of club or organization: Not on file    Attends meetings of clubs or  organizations: Not on file    Relationship status: Not on file  . Intimate partner violence:    Fear of current or ex partner: Not on file    Emotionally abused: Not on file    Physically abused: Not on file    Forced sexual activity: Not on file  Other Topics Concern  . Not on file  Social History Narrative  . Not on file    Allergies:  Allergies  Allergen Reactions  . Amitriptyline Other (See Comments)    Nervous ending pulse  . Amoxil [Amoxicillin]   . Corn-Containing Products     And other foods - diarrhea  . Depakote [Divalproex Sodium] Other (See Comments)    Uncontrolled muscle movement  . Erythromycin   . Imitrex [Sumatriptan]   . Skelaxin [Metaxalone]   . Ultracet [Tramadol-Acetaminophen] Itching  . Tetracyclines & Related Rash  . Venlafaxine Other (See Comments)    Night sweats.    Medications: Prior to Admission medications   Medication Sig Start Date End Date Taking? Authorizing Provider  albuterol (PROVENTIL) (2.5 MG/3ML) 0.083% nebulizer solution Take 2.5 mg by nebulization every 6 (six) hours as needed for wheezing or shortness of breath.   Yes [provider]  aspirin-acetaminophen-caffeine (EXCEDRIN MIGRAINE) [phone] MG tablet Take 1 tablet by mouth every 6 (six) hours as needed for headache.   Yes [provider]  Aspirin-Acetaminophen-Caffeine (GOODY HEADACHE PO) Take by mouth as needed.   Yes [provider]  Ivermectin (SOOLANTRA) 1 % CREA Apply 1 application topically daily. 07/10/16  Yes [provider]  lamoTRIgine (LAMICTAL) 200 MG tablet Take 200 mg by mouth daily.  11/20/13  Yes [provider]  Melatonin 3 MG TABS Take 12 mg by mouth at bedtime.   Yes [provider]  naproxen (NAPROSYN) 500 MG tablet Take 1 tablet by mouth as needed. 07/30/16  Yes [provider]  sertraline (ZOLOFT) 50 MG tablet Take 1 tablet by mouth daily. 12/23/17  Yes [provider]  traZODone (DESYREL)  100 MG tablet Take 1 tablet by mouth as needed. 01/10/18  Yes [provider]  valACYclovir (VALTREX) 1000 MG tablet Take 1 tablet by mouth as needed. 01/20/18  Yes [provider]  ALPRAZolam Duanne Moron) 0.5 MG tablet Take 0.5 mg by mouth 3 (three) times daily as needed.  11/19/13   [provider]    Physical Exam  Vitals: BP 130/60 (BP Location: Right Arm, Patient Position: Sitting, Cuff Size: Normal)   Pulse 93   Ht 5' 2.5" (1.588 m)   Wt 172 lb (78 kg)   BMI 30.96 kg/m          Physical Exam  Constitutional: She is oriented to person, place, and time. She appears well-developed and well-nourished. No distress.  Cardiovascular: Normal rate.  Respiratory: Effort normal.    There is a tender breast mass at 3 o'clock near the nipple, slightly smaller than pea size, round in shape. No skin or nipple changes, No nipple discharge.   Neurological: She is alert and oriented to person, place, and time.  Skin: Skin is warm and dry.  Psychiatric: She has a normal mood and affect.     Assessment: 57 y.o. G0P0 with subtle tender breast mass in right breast Family history of breast cancer Personal history of fibrocystic changes of breast/ increased lifetime risk of breast cancer (26%)  Plan: Will get diagnostic mammogram for annual and ultrasound on right breast as needed Problem List Items Addressed This Visit    None    Visit Diagnoses    Screening for breast cancer    -  Primary   Relevant Orders   MM DIAG BREAST TOMO BILATERAL   Breast mass, right       Relevant Orders   MM DIAG BREAST TOMO BILATERAL   US BREAST LTD UNI RIGHT INC AXILLA    DIscussed [place] update testing due to her strong family history and she agrees. Blood drawn. Follow up for results in 6 weeks.  Dalia Heading, CNM

## 2018-02-24 ENCOUNTER — Encounter: Payer: Self-pay | Admitting: Radiology

## 2018-02-24 ENCOUNTER — Ambulatory Visit
Admission: RE | Admit: 2018-02-24 | Discharge: 2018-02-24 | Disposition: A | Discharge status: Home / Self Care | Payer: BLUE CROSS/BLUE SHIELD | Source: Ambulatory Visit | Attending: Certified Nurse Midwife | Admitting: Certified Nurse Midwife

## 2018-02-24 DIAGNOSIS — N6310 Unspecified lump in the right breast, unspecified quadrant: Secondary | ICD-10-CM | POA: Diagnosis present

## 2018-02-24 DIAGNOSIS — Z1231 Encounter for screening mammogram for malignant neoplasm of breast: Secondary | ICD-10-CM | POA: Insufficient documentation

## 2018-02-24 DIAGNOSIS — Z1239 Encounter for other screening for malignant neoplasm of breast: Secondary | ICD-10-CM

## 2018-03-06 ENCOUNTER — Encounter: Payer: Self-pay | Admitting: Obstetrics and Gynecology

## 2018-03-14 ENCOUNTER — Encounter: Payer: Self-pay | Admitting: Family Medicine

## 2018-03-14 ENCOUNTER — Ambulatory Visit: Payer: BLUE CROSS/BLUE SHIELD | Admitting: Family Medicine

## 2018-03-14 VITALS — BP 132/72 | HR 75 | Temp 98.5°F | Ht 62.5 in | Wt 173.0 lb

## 2018-03-14 DIAGNOSIS — F3170 Bipolar disorder, currently in remission, most recent episode unspecified: Secondary | ICD-10-CM

## 2018-03-14 DIAGNOSIS — R7303 Prediabetes: Secondary | ICD-10-CM

## 2018-03-14 DIAGNOSIS — G43909 Migraine, unspecified, not intractable, without status migrainosus: Secondary | ICD-10-CM | POA: Diagnosis not present

## 2018-03-14 DIAGNOSIS — Z7689 Persons encountering health services in other specified circumstances: Secondary | ICD-10-CM

## 2018-03-14 DIAGNOSIS — E7800 Pure hypercholesterolemia, unspecified: Secondary | ICD-10-CM

## 2018-03-14 DIAGNOSIS — J4520 Mild intermittent asthma, uncomplicated: Secondary | ICD-10-CM | POA: Diagnosis not present

## 2018-03-14 MED ORDER — ALBUTEROL SULFATE HFA 108 (90 BASE) MCG/ACT IN AERS: 2.0000 | INHALATION_SPRAY | RESPIRATORY_TRACT | 1 refills | Status: DC | PRN

## 2018-03-14 NOTE — Patient Instructions (Addendum)
It was a pleasure to meet you today! I look forward to partnering with you for your health care needs  I have sent in a prescription for albuterol to your pharmacy  Please follow up in 6 months, sooner if needed

## 2018-03-14 NOTE — Progress Notes (Signed)
 Subjective:    Patient ID: Krystal Smith, female    DOB: 08/04/1960, 57 y.o.   MRN: [id number]  HPI This is a 57 yo female who presents today to establish care. Lives with her husband, enjoys [place]. Takes care of her mother. She is a homemaker. Has an adopted child (Krystal Smith, 17).    Last CPE- gyn 7/19 Mammo- 02/24/18 Pap- 7/19, negative  Colonoscopy- has a cologuard kit at her house Tdap- unknown Flu- never, declines Eye- 2/19 Dental- once a year Exercise- not currently Diet- has improved her diet since getting recent lab results with elevated cholesterol and prediabetes. Diet recall- breakfast- eggs or leftovers, Lunch- leftovers Dinner- meat, pasta Eats out frequently, drinks diet soda, occasional sweet tea Sleep disturbance- awake for several hours during the night, sometimes feels rested. Didn't sleep well for two years.  Post menopausal- continues to have hot flashes. Sees gyn  Asthma- rarely needs albuterol inhaler, 3-4 times a year. She requests refill of albuterol inhaler.  Migraines- 1-3 significant episodes a year. Knows some triggers- foods, smells. Occasionally requires office visit and "a shot."   No chest pain or SOB. No wheeze. No diarrhea/constipatio or abdominal pain, no dysuria, no hematuria.   Sees Dr. Kapur. Well managed on current meds.   Decreased libido over last 6 years. Some pain with intercourse. On vaginal suppository from gyn.   Noticed some difficulty finding her words, has gotten worse lately. Does not loose things, does not get lost when driving. Mostly with her husband and son, thinks she may be bored with the conversation and is distracted.   Past Medical History:  Diagnosis Date  . Allergic rhinitis   . Anxiety   . Asthma   . Bacterial pneumonia 12/2008   HOSP X2D  . Bipolar disorder ([facility])   . BRCA negative 2013; 8/19   2013 BRCA neg; 2019 MyRisk neg except MSH3 VUS  . Depression   . Family history of breast cancer    mother  age 66, mat aunt-two primary br cas in either breast-first cancer age 50, mgac breast and pancreatic  . Fibroadenoma of breast   . IBS (irritable bowel syndrome)   . Increased risk of breast cancer 02/2018   IBIS=24.7%/riskscore=37.6%  . Infertility   . Migraine   . Rosacea   . Vitamin D deficiency    Past Surgical History:  Procedure Laterality Date  . BREAST BIOPSY Right 2000   fibroadenoma, core biopsy.  . BREAST BIOPSY Left 06-21-14   stereotatic, fibrocystic changes with microcalcifications. BRCA testing neagative  . BREAST CYST ASPIRATION Left 04/2006  . BREAST CYST ASPIRATION Right 2007  . NASAL SINUS SURGERY  2004  . PECTUS EXCAVATUM REPAIR     age 10 at [facility]  . Repair left ankle fracture    . TONSILLECTOMY    . WISDOM TOOTH EXTRACTION     Family History  Problem Relation Age of Onset  . Breast cancer Mother 66  . Hypertension Mother   . Thyroid disease Mother   . Osteoporosis Mother   . Cancer Father        lymphoma; Squamous cell of face  . Dementia Father   . Heart disease Father        MI AND CABG  . Breast cancer Maternal Aunt 50       Bilateral Cancer/Mastectomy  . Cancer Other        cancerous tumor on kidney  . Pancreatic cancer Other 65       Review of Systems Per HPI    Objective:   Physical Exam Physical Exam  Constitutional: Oriented to person, place, and time. She appears well-developed and well-nourished.  HENT:  Head: Normocephalic and atraumatic.  Eyes: Conjunctivae are normal.  Neck: Normal range of motion. Neck supple.  Cardiovascular: Normal rate, regular rhythm and normal heart sounds.   Pulmonary/Chest: Effort normal and breath sounds normal.  Musculoskeletal: Normal range of motion.  Neurological: Alert and oriented to person, place, and time. Fluent conversation and expression of thoughts.  Skin: Skin is warm and dry.  Psychiatric: Normal mood and affect. Behavior is normal. Judgment and thought content normal.  Vitals  reviewed.     BP 132/72 (BP Location: Right Arm, Patient Position: Sitting, Cuff Size: Normal)   Pulse 75   Temp 98.5 F (36.9 C) (Oral)   Ht 5' 2.5" (1.588 m)   Wt 173 lb (78.5 kg)   SpO2 95%   BMI 31.14 kg/m      Assessment & Plan:  1. Encounter to establish care - Discussed and encouraged healthy lifestyle choices- adequate sleep, regular exercise, stress management and healthy food choices.   2. Mild intermittent asthma without complication - Patient was instructed to RTC if using albuterol inhaler more than 3x per week to discuss additional treatment - albuterol (PROVENTIL HFA;VENTOLIN HFA) 108 (90 Base) MCG/ACT inhaler; Inhale 2 puffs into the lungs every 4 (four) hours as needed for wheezing or shortness of breath (cough, shortness of breath or wheezing.).  Dispense: 1 Inhaler; Refill: 1  3. Bipolar affective disorder in remission [facility]) - continued follow up with Dr. Nicolasa Ducking  4. Migraine without status migrainosus, not intractable, unspecified migraine type - Rare episodes, encouraged her to avoid triggers, adequately hydrate and get enough sleep  5. Prediabetes - continue to work on weight loss and healthy food choices  6. Elevated LDL cholesterol level - encouraged increased exercise, weight loss  - follow up in 6 months, recheck labs Clarene Reamer, FNP-BC  [facility], [place]  03/15/2018 7:12 AM

## 2018-03-15 DIAGNOSIS — R7303 Prediabetes: Secondary | ICD-10-CM | POA: Insufficient documentation

## 2018-04-08 ENCOUNTER — Ambulatory Visit: Payer: BLUE CROSS/BLUE SHIELD | Admitting: Certified Nurse Midwife

## 2018-04-15 ENCOUNTER — Telehealth: Payer: Self-pay | Admitting: Family Medicine

## 2018-04-15 NOTE — Telephone Encounter (Signed)
Pt dropped off senior hiking program form to be signed. Placed in Rx tower.

## 2018-04-15 NOTE — Telephone Encounter (Signed)
Noted Placed in Debbie's sign basket.

## 2018-04-16 NOTE — Telephone Encounter (Signed)
Form Completed and placed up front for pick up. Patient is aware nothing further needed at this time.

## 2018-04-18 ENCOUNTER — Ambulatory Visit (INDEPENDENT_AMBULATORY_CARE_PROVIDER_SITE_OTHER): Payer: BLUE CROSS/BLUE SHIELD | Admitting: Certified Nurse Midwife

## 2018-04-18 VITALS — BP 120/60 | HR 90 | Ht 63.0 in | Wt 175.5 lb

## 2018-04-18 DIAGNOSIS — N941 Unspecified dyspareunia: Secondary | ICD-10-CM

## 2018-04-18 DIAGNOSIS — Z803 Family history of malignant neoplasm of breast: Secondary | ICD-10-CM

## 2018-04-18 DIAGNOSIS — Z9189 Other specified personal risk factors, not elsewhere classified: Secondary | ICD-10-CM

## 2018-04-20 ENCOUNTER — Encounter: Payer: Self-pay | Admitting: Certified Nurse Midwife

## 2018-04-20 DIAGNOSIS — Z9189 Other specified personal risk factors, not elsewhere classified: Secondary | ICD-10-CM | POA: Insufficient documentation

## 2018-04-20 NOTE — Progress Notes (Signed)
Obstetrics & Gynecology Office Visit   Chief Complaint:  Chief Complaint  Patient presents with  . Follow-up    Myriad results    History of Present Illness: 57 year old postmenopausal female presents for the results of her [place] test.  (She has had negative HBOC testing in the past). There were no clinically significant mutations identified. There was a variant of undetermined significance on the [facility] gene. Her MYRISK lifetime risk of breast cancer was 37.6%. Lifetime risk by the TC model was 24.7%.  DIscussed options for increased surveillance ( annual MRIs in addition to annual mammograms and twice a year professional breast exams) and for risk reduction (Tamoxifen or prophylactic mastectomies)   Her most recent mammogram obtained was on8/06/2018 and was BIRADS2 (had a palpable cyst in the right breast). She had a stereotactic biopsy in December 2015 on the left breast that was benign. She also had right breast biopsy in 2000 that was a fibroadenoma.She has also had breast cysts aspirated on both breasts in 2007. There is a positive history of breast cancer in her mother, maternal aunt and maternal great aunt.   In addition, she began to use Intrarosa for dyspareunia/ vaginal dryness and feels like this has helped somewhat.  Review of Systems:  ROS    Past Medical History:  Past Medical History:  Diagnosis Date  . Allergic rhinitis   . Anxiety   . Asthma   . Bacterial pneumonia 12/2008   HOSP X2D  . Bipolar disorder ([place])   . BRCA negative 2013; 8/19   2013 BRCA neg; 2019 MyRisk neg except MSH3 VUS  . Depression   . Family history of breast cancer    mother age 45, mat aunt-two primary br cas in either breast-first cancer age 56, mgac breast and pancreatic  . Fibroadenoma of breast   . IBS (irritable bowel syndrome)   . Increased risk of breast cancer 02/2018   IBIS=24.7%/riskscore=37.6%  . Infertility   . Migraine   . Rosacea   . Vitamin D deficiency     Past  Surgical History:  Past Surgical History:  Procedure Laterality Date  . BREAST BIOPSY Right 2000   fibroadenoma, core biopsy.  Marland Kitchen BREAST BIOPSY Left 06-21-14   stereotatic, fibrocystic changes with microcalcifications. BRCA testing neagative  . BREAST CYST ASPIRATION Left 04/2006  . BREAST CYST ASPIRATION Right 2007  . NASAL SINUS SURGERY  2004  . PECTUS EXCAVATUM REPAIR     age 63 at [facility]  . Repair left ankle fracture    . TONSILLECTOMY    . WISDOM TOOTH EXTRACTION      Gynecologic History: No LMP recorded. Patient is postmenopausal.  Obstetric History: G0P0  Family History:  Family History  Problem Relation Age of Onset  . Breast cancer Mother 48  . Hypertension Mother   . Thyroid disease Mother   . Osteoporosis Mother   . Cancer Father        lymphoma; Squamous cell of face  . Dementia Father   . Heart disease Father        MI AND CABG  . Breast cancer Maternal Aunt 50       Bilateral Cancer/Mastectomy  . Cancer Other        cancerous tumor on kidney  . Pancreatic cancer Other 40    Social History:  Social History   Socioeconomic History  . Marital status: Married    Spouse name: Not on file  . Number of children:  1  . Years of education: 85  . Highest education level: Not on file  Occupational History  . Occupation: HOMEMAKER  Social Needs  . Financial resource strain: Not on file  . Food insecurity:    Worry: Not on file    Inability: Not on file  . Transportation needs:    Medical: Not on file    Non-medical: Not on file  Tobacco Use  . Smoking status: Former Research scientist (life sciences)  . Smokeless tobacco: Never Used  . Tobacco comment: QUIT 1994  Substance and Sexual Activity  . Alcohol use: Yes    Alcohol/week: 0.0 standard drinks    Comment: 0-1 BEER  . Drug use: No  . Sexual activity: Yes    Partners: Male    Birth control/protection: Post-menopausal  Lifestyle  . Physical activity:    Days per week: Not on file    Minutes per session: Not on file  .  Stress: Not on file  Relationships  . Social connections:    Talks on phone: Not on file    Gets together: Not on file    Attends religious service: Not on file    Active member of club or organization: Not on file    Attends meetings of clubs or organizations: Not on file    Relationship status: Not on file  . Intimate partner violence:    Fear of current or ex partner: Not on file    Emotionally abused: Not on file    Physically abused: Not on file    Forced sexual activity: Not on file  Other Topics Concern  . Not on file  Social History Narrative  . Not on file    Allergies:  Allergies  Allergen Reactions  . Amitriptyline Other (See Comments)    Nervous ending pulse  . Amoxil [Amoxicillin]   . Corn-Containing Products     And other foods - diarrhea  . Depakote [Divalproex Sodium] Other (See Comments)    Uncontrolled muscle movement  . Erythromycin   . Imitrex [Sumatriptan]   . Skelaxin [Metaxalone]   . Ultracet [Tramadol-Acetaminophen] Itching  . Tetracyclines & Related Rash  . Venlafaxine Other (See Comments)    Night sweats.    Medications: Prior to Admission medications   Medication Sig Start Date End Date Taking? Authorizing Provider  albuterol (PROVENTIL HFA;VENTOLIN HFA) 108 (90 Base) MCG/ACT inhaler Inhale 2 puffs into the lungs every 4 (four) hours as needed for wheezing or shortness of breath (cough, shortness of breath or wheezing.). 03/14/18  Yes Elby Beck, FNP  aspirin-acetaminophen-caffeine (EXCEDRIN MIGRAINE) [phone] MG tablet Take 1 tablet by mouth every 6 (six) hours as needed for headache.   Yes [provider]  Aspirin-Acetaminophen-Caffeine (GOODY HEADACHE PO) Take by mouth as needed.   Yes [provider]  Ivermectin (SOOLANTRA) 1 % CREA Apply 1 application topically daily. 07/10/16  Yes [provider]  lamoTRIgine (LAMICTAL) 200 MG tablet Take 200 mg by mouth daily.  11/20/13  Yes [provider]    Melatonin 3 MG TABS Take 12 mg by mouth at bedtime.   Yes [provider]  naproxen (NAPROSYN) 500 MG tablet Take 1 tablet by mouth as needed. 07/30/16  Yes [provider]  sertraline (ZOLOFT) 50 MG tablet Take 1 tablet by mouth daily. 12/23/17  Yes [provider]  traZODone (DESYREL) 100 MG tablet Take 1 tablet by mouth as needed. 01/10/18  Yes [provider]  valACYclovir (VALTREX) 1000 MG tablet Take 1  tablet by mouth as needed. 01/20/18  Yes [provider]   [place]on Reek  Physical Exam Vitals:BP 120/60   Pulse 90   Ht _0  (1.6 m)   Wt 175 lb 8 oz (79.6 kg)   BMI 31.09 kg/m      Physical Exam  Constitutional: She is oriented to person, place, and time. She appears well-developed and well-nourished.  Neurological: She is alert and oriented to person, place, and time.  Psychiatric: She has a normal mood and affect. Her behavior is normal.     Assessment: 57 y.o. G0P0 family history of breast cancer Negative MYRISK test but still is high risk for breast cancer Some decrease in vaginal dryness with Intrarosa  Plan: Patient declines MRIs of the breast and is not interested in further genetic counseling and discussion of risk reduction strategies. Continue Intrarosa and let me know if needing refills. Return in Jan/Feb for breast exam if desires and prn

## 2018-05-14 ENCOUNTER — Encounter: Payer: Self-pay | Admitting: Certified Nurse Midwife

## 2018-05-14 LAB — COLOGUARD

## 2018-09-15 ENCOUNTER — Ambulatory Visit: Payer: BLUE CROSS/BLUE SHIELD | Admitting: Family Medicine

## 2018-10-20 ENCOUNTER — Ambulatory Visit: Payer: BLUE CROSS/BLUE SHIELD | Admitting: Family Medicine

## 2019-03-27 ENCOUNTER — Other Ambulatory Visit: Payer: Self-pay | Admitting: Certified Nurse Midwife

## 2019-03-27 DIAGNOSIS — Z1231 Encounter for screening mammogram for malignant neoplasm of breast: Secondary | ICD-10-CM

## 2019-04-30 ENCOUNTER — Ambulatory Visit: Payer: BLUE CROSS/BLUE SHIELD | Admitting: Certified Nurse Midwife

## 2019-05-01 ENCOUNTER — Ambulatory Visit
Admission: RE | Admit: 2019-05-01 | Discharge: 2019-05-01 | Disposition: A | Discharge status: Home / Self Care | Payer: 59 | Source: Ambulatory Visit | Attending: Certified Nurse Midwife | Admitting: Certified Nurse Midwife

## 2019-05-01 DIAGNOSIS — Z1231 Encounter for screening mammogram for malignant neoplasm of breast: Secondary | ICD-10-CM | POA: Diagnosis present

## 2019-05-26 ENCOUNTER — Other Ambulatory Visit (HOSPITAL_COMMUNITY)
Admission: RE | Admit: 2019-05-26 | Discharge: 2019-05-26 | Disposition: A | Discharge status: Home / Self Care | Payer: 59 | Source: Ambulatory Visit | Attending: Certified Nurse Midwife | Admitting: Certified Nurse Midwife

## 2019-05-26 ENCOUNTER — Ambulatory Visit (INDEPENDENT_AMBULATORY_CARE_PROVIDER_SITE_OTHER): Payer: 59 | Admitting: Certified Nurse Midwife

## 2019-05-26 ENCOUNTER — Telehealth: Payer: Self-pay | Admitting: Certified Nurse Midwife

## 2019-05-26 ENCOUNTER — Encounter: Payer: Self-pay | Admitting: Certified Nurse Midwife

## 2019-05-26 ENCOUNTER — Other Ambulatory Visit: Payer: Self-pay

## 2019-05-26 VITALS — BP 130/60 | HR 74 | Ht 63.0 in | Wt 181.0 lb

## 2019-05-26 DIAGNOSIS — Z13820 Encounter for screening for osteoporosis: Secondary | ICD-10-CM

## 2019-05-26 DIAGNOSIS — Z780 Asymptomatic menopausal state: Secondary | ICD-10-CM

## 2019-05-26 DIAGNOSIS — Z124 Encounter for screening for malignant neoplasm of cervix: Secondary | ICD-10-CM | POA: Insufficient documentation

## 2019-05-26 DIAGNOSIS — Z803 Family history of malignant neoplasm of breast: Secondary | ICD-10-CM

## 2019-05-26 DIAGNOSIS — Z01419 Encounter for gynecological examination (general) (routine) without abnormal findings: Secondary | ICD-10-CM | POA: Diagnosis not present

## 2019-05-26 DIAGNOSIS — N941 Unspecified dyspareunia: Secondary | ICD-10-CM

## 2019-05-26 DIAGNOSIS — R7309 Other abnormal glucose: Secondary | ICD-10-CM

## 2019-05-26 DIAGNOSIS — E785 Hyperlipidemia, unspecified: Secondary | ICD-10-CM

## 2019-05-26 NOTE — Progress Notes (Signed)
Gynecology Annual Exam  PCP: Elby Beck, FNP  Chief Complaint:  Chief Complaint  Patient presents with  . Gynecologic Exam    History of Present Illness:Krystal Smith presents today for her annual exam. She is a 58 year old Caucasian/White female , G 0 P 0 0 0 0 , whose LMP was January 2015 (spotting x1day)  She was prescribed Intrarosa at her last appointment, which did relieve vaginal dryness, but she has stopped using it.Has declined treatment with estrogen due to family history of breast cancer in her mother and maternal aunt She rarely has hot flashes. No spotting.  The patient's past medical history is notable for bipolar disorder and currently takes Lamictal and sertraline. . She also has a hx of migraines, allergies, fibrocystic breasts, rosecea, osteoarthritis of her knees,and mild intermittent asthma..  Since her last exam 02/05/2018, she has gained 9#.   Her most recent pap smear was obtained 02/05/2018  and was NIL. Her most recent mammogram obtained on 05/01/2019 and was negative. She had a stereotactic biopsy in December 2015 on the left breast that was benign. She also had right breast biopsies x 2 that were benign.  There is a positive history of breast cancer in her mother and maternal aunt. Genetic testing has been done. The patient tested negative for MYRISK. She has a lifetime risk of breast cancer of 37.6% according [facility] and 24.7% according to the [facility]. She did have a variant of unknown significance of MSH3.  There is no family history of ovarian cancer. The patient does do monthly self breast exams.  Colon cancer screening done 05/14/2018 with Cologuard which was negative. A DEXA scan has not been done and she is eligible The patient does not smoke.  The patient rarely drinks alcohol  The patient does not use illegal drugs.  The patient has been exercising by walking 1-2 miles 2x/week. The patient does get adequate calcium in her diet.  She  had a recent cholesterol screen in 2019 that was borderline and her hemoglobin A1C wa 5.8%.   Review of Systems: Review of Systems  Constitutional: Negative for chills, fever and weight loss.  HENT: Negative for congestion, sinus pain and sore throat.   Eyes: Negative for blurred vision and pain.  Respiratory: Negative for hemoptysis, shortness of breath and wheezing.   Cardiovascular: Negative for chest pain, palpitations and leg swelling.  Gastrointestinal: Negative for abdominal pain, blood in stool, diarrhea, heartburn, nausea and vomiting.  Genitourinary: Negative for dysuria, frequency, hematuria and urgency.       Positive for dyspareunia and decreased libido.  Musculoskeletal: Positive for joint pain (knees). Negative for back pain and myalgias.  Skin: Positive for itching. Negative for rash.  Neurological: Positive for headaches. Negative for dizziness and tingling.  Endo/Heme/Allergies: Positive for environmental allergies. Negative for polydipsia.       Negative for hirsutism; positive for hot flashes   Psychiatric/Behavioral: Negative for depression. The patient is not nervous/anxious and does not have insomnia.   Breasts: tenderness bilaterally  Past Medical History:  Past Medical History:  Diagnosis Date  . Allergic rhinitis   . Anxiety   . Asthma   . Bacterial pneumonia 12/2008   HOSP X2D  . Bipolar disorder ([place])   . BRCA negative 2013; 8/19   2013 BRCA neg; 2019 MyRisk neg except MSH3 VUS  . Depression   . Family history of breast cancer    mother age 52, mat aunt-two primary  br cas in either breast-first cancer age 67, mgac breast and pancreatic  . Fibroadenoma of breast   . IBS (irritable bowel syndrome)   . Increased risk of breast cancer 02/2018   IBIS=24.7%/riskscore=37.6%  . Infertility   . Migraine   . Rosacea   . Vitamin D deficiency     Past Surgical History:  Past Surgical History:  Procedure Laterality Date  . BREAST BIOPSY Right 2000    fibroadenoma, core biopsy.  Marland Kitchen BREAST BIOPSY Left 06-21-14   stereotatic, fibrocystic changes with microcalcifications. BRCA testing neagative  . BREAST CYST ASPIRATION Left 04/2006  . BREAST CYST ASPIRATION Right 2007  . NASAL SINUS SURGERY  2004  . PECTUS EXCAVATUM REPAIR     age 72 at [facility]  . Repair left ankle fracture    . TONSILLECTOMY    . WISDOM TOOTH EXTRACTION      Family History:  Family History  Problem Relation Age of Onset  . Breast cancer Mother 31  . Hypertension Mother   . Thyroid disease Mother   . Osteoporosis Mother   . Cancer Father        lymphoma; Squamous cell of face  . Dementia Father   . Heart disease Father        MI AND CABG  . Breast cancer Maternal Aunt 50       Bilateral Cancer/Mastectomy  . Cancer Other        cancerous tumor on kidney  . Pancreatic cancer Other 30    Social History:  Social History   Socioeconomic History  . Marital status: Married    Spouse name: Not on file  . Number of children: 1  . Years of education: 33  . Highest education level: Not on file  Occupational History  . Occupation: HOMEMAKER  Social Needs  . Financial resource strain: Not on file  . Food insecurity    Worry: Not on file    Inability: Not on file  . Transportation needs    Medical: Not on file    Non-medical: Not on file  Tobacco Use  . Smoking status: Former Research scientist (life sciences)  . Smokeless tobacco: Never Used  . Tobacco comment: QUIT 1994  Substance and Sexual Activity  . Alcohol use: Yes    Alcohol/week: 0.0 standard drinks    Comment: 0-1 BEER  . Drug use: No  . Sexual activity: Yes    Partners: Male    Birth control/protection: Post-menopausal  Lifestyle  . Physical activity    Days per week: Not on file    Minutes per session: Not on file  . Stress: Not on file  Relationships  . Social Herbalist on phone: Not on file    Gets together: Not on file    Attends religious service: Not on file    Active member of club or  organization: Not on file    Attends meetings of clubs or organizations: Not on file    Relationship status: Not on file  . Intimate partner violence    Fear of current or ex partner: Not on file    Emotionally abused: Not on file    Physically abused: Not on file    Forced sexual activity: Not on file  Other Topics Concern  . Not on file  Social History Narrative  . Not on file    Allergies:  Allergies  Allergen Reactions  . Amitriptyline Other (See Comments)    Nervous ending pulse  .  Amoxil [Amoxicillin]   . Corn-Containing Products     And other foods - diarrhea  . Depakote [Divalproex Sodium] Other (See Comments)    Uncontrolled muscle movement  . Erythromycin   . Imitrex [Sumatriptan]   . Skelaxin [Metaxalone]   . Ultracet [Tramadol-Acetaminophen] Itching  . Tetracyclines & Related Rash  . Venlafaxine Other (See Comments)    Night sweats.    Medications: Current Outpatient Medications on File Prior to Visit  Medication Sig Dispense Refill  . albuterol (PROVENTIL HFA;VENTOLIN HFA) 108 (90 Base) MCG/ACT inhaler Inhale 2 puffs into the lungs every 4 (four) hours as needed for wheezing or shortness of breath (cough, shortness of breath or wheezing.). 1 Inhaler 1  . aspirin-acetaminophen-caffeine (EXCEDRIN MIGRAINE) 250-250-65 MG tablet Take 1 tablet by mouth every 6 (six) hours as needed for headache.    . Ivermectin (SOOLANTRA) 1 % CREA Apply 1 application topically daily.    Marland Kitchen lamoTRIgine (LAMICTAL) 200 MG tablet Take 200 mg by mouth daily.     . Melatonin 3 MG TABS Take 12 mg by mouth at bedtime.    . sertraline (ZOLOFT) 50 MG tablet Take 1 tablet by mouth daily.    . traZODone (DESYREL) 100 MG tablet Take 1 tablet by mouth as needed.    . valACYclovir (VALTREX) 1000 MG tablet Take 1 tablet by mouth as needed.     No current facility-administered medications on file prior to visit.    Physical Exam Vitals: BP 130/60   Pulse 74   Ht _0  (1.6 m)   Wt 181 lb  (82.1 kg)   BMI 32.06 kg/m   General: WF in NAD HEENT: normocephalic, anicteric Neck: no thyroid enlargement, no palpable nodules, no cervical lymphadenopathy  Pulmonary: No increased work of breathing, CTAB Cardiovascular: RRR, without murmur  Breast: Breast symmetrical, no tenderness, no palpable nodules or masses, no skin or nipple retraction present, no nipple discharge.  No axillary, infraclavicular or supraclavicular lymphadenopathy. Abdomen: Soft, generalized tenderness, non-distended.  Umbilicus without lesions.  No hepatomegaly or masses palpable. No evidence of hernia. Genitourinary:  External: Normal external female genitalia.  Normal urethral meatus, normal Bartholin's and Skene's glands.    Vagina: Normal vaginal mucosa, no evidence of prolapse.   Cervix: Grossly normal in appearance, non-tender, stenotic  Uterus: MP, small,  mobile, and non-tender  Adnexa: No adnexal masses, non-tender  Rectal: deferred  Lymphatic: no evidence of inguinal lymphadenopathy Extremities: no edema, erythema, or tenderness Neurologic: Grossly intact Psychiatric: mood appropriate, affect full     Assessment: 58 y.o. G0P0 well woman exam Dyspareunia -declines further treatment Family history of breast cancer an increased risk of breast cancer  Plan:    1) Breast cancer screening - recommend monthly self breast exam and annual screening mammograms. Mammogram is UTD.    2) Colon cancer screening: Cologuard is UTD.   3) Cervical cancer screening - Pap was done. ASCCP guidelines and rational discussed.  Patient opts for yearly screening interval  4) Dexa scan ordered  5) Routine healthcare maintenance including cholesterol and diabetes screening ordered today-  6) RTO in 1 year and prn   Dalia Heading, CNM

## 2019-05-26 NOTE — Telephone Encounter (Signed)
Called and spoke with patient made aware of her appt time and date at [facility] for her Bone Density exam on Mon Jun 01, 2019 @ 9:00am. Pt is aware to not take calcium the day of her appt.

## 2019-05-27 LAB — LIPID PANEL
Chol/HDL Ratio: 3.4 ratio (ref 0.0–4.4)
Cholesterol, Total: 204 mg/dL — ABNORMAL HIGH (ref 100–199)
HDL: 60 mg/dL (ref >39)
LDL Chol Calc (NIH): 109 mg/dL — ABNORMAL HIGH (ref 0–99)
Triglycerides: 206 mg/dL — ABNORMAL HIGH (ref 0–149)
VLDL Cholesterol Cal: 35 mg/dL (ref 5–40)

## 2019-05-27 LAB — HEMOGLOBIN A1C
Est. average glucose Bld gHb Est-mCnc: 126 mg/dL
Hgb A1c MFr Bld: 6 % — ABNORMAL HIGH (ref 4.8–5.6)

## 2019-05-29 LAB — CYTOLOGY - PAP
Comment: NEGATIVE
Diagnosis: NEGATIVE
High risk HPV: NEGATIVE

## 2019-06-01 ENCOUNTER — Other Ambulatory Visit: Payer: 59

## 2019-07-27 ENCOUNTER — Other Ambulatory Visit: Payer: 59

## 2019-08-21 ENCOUNTER — Telehealth: Payer: Self-pay | Admitting: Family Medicine

## 2019-08-21 NOTE — Telephone Encounter (Signed)
Patient needs to be seen for evaluation prior to referral. Please schedule her for an in office visit.

## 2019-08-21 NOTE — Telephone Encounter (Signed)
Patient called requesting a referral to [facility]  She stated she thinks she may either have an ulcer or kidney stones.  She wanted to know if she will need to have an office with [place] before this referral can be done  Please advise

## 2019-08-21 NOTE — Telephone Encounter (Signed)
Left message asking pt to call office  °

## 2019-08-25 NOTE — Telephone Encounter (Signed)
Pt scheduled with Debbie on Friday 08/28/19 at 11:45  Nothing further needed.

## 2019-08-27 ENCOUNTER — Ambulatory Visit
Admission: RE | Admit: 2019-08-27 | Discharge: 2019-08-27 | Disposition: A | Discharge status: Home / Self Care | Payer: 59 | Source: Ambulatory Visit | Attending: Certified Nurse Midwife | Admitting: Certified Nurse Midwife

## 2019-08-27 DIAGNOSIS — Z13820 Encounter for screening for osteoporosis: Secondary | ICD-10-CM | POA: Diagnosis present

## 2019-08-27 DIAGNOSIS — Z780 Asymptomatic menopausal state: Secondary | ICD-10-CM | POA: Diagnosis present

## 2019-08-28 ENCOUNTER — Ambulatory Visit: Payer: 59 | Admitting: Family Medicine

## 2019-08-28 ENCOUNTER — Encounter: Payer: Self-pay | Admitting: Family Medicine

## 2019-08-28 ENCOUNTER — Other Ambulatory Visit: Payer: Self-pay

## 2019-08-28 VITALS — BP 128/80 | HR 80 | Temp 97.9°F | Ht 63.0 in | Wt 180.8 lb

## 2019-08-28 DIAGNOSIS — R1013 Epigastric pain: Secondary | ICD-10-CM | POA: Diagnosis not present

## 2019-08-28 LAB — COMPREHENSIVE METABOLIC PANEL
AG Ratio: 1.8 (calc) (ref 1.0–2.5)
ALT: 13 U/L (ref 6–29)
AST: 15 U/L (ref 10–35)
Albumin: 4.2 g/dL (ref 3.6–5.1)
Alkaline phosphatase (APISO): 76 U/L (ref 37–153)
BUN: 21 mg/dL (ref 7–25)
CO2: 29 mmol/L (ref 20–32)
Calcium: 9.4 mg/dL (ref 8.6–10.4)
Chloride: 102 mmol/L (ref 98–110)
Creat: 0.65 mg/dL (ref 0.50–1.05)
Globulin: 2.3 g/dL (calc) (ref 1.9–3.7)
Glucose, Bld: 105 mg/dL — ABNORMAL HIGH (ref 65–99)
Potassium: 4.2 mmol/L (ref 3.5–5.3)
Sodium: 140 mmol/L (ref 135–146)
Total Bilirubin: 0.5 mg/dL (ref 0.2–1.2)
Total Protein: 6.5 g/dL (ref 6.1–8.1)

## 2019-08-28 LAB — CBC WITH DIFFERENTIAL/PLATELET
Absolute Monocytes: 621 cells/uL (ref 200–950)
Basophils Absolute: 88 cells/uL (ref 0–200)
Basophils Relative: 1.2 %
Eosinophils Absolute: 153 cells/uL (ref 15–500)
Eosinophils Relative: 2.1 %
HCT: 43.7 % (ref 35.0–45.0)
Hemoglobin: 14.8 g/dL (ref 11.7–15.5)
Lymphs Abs: 1774 cells/uL (ref 850–3900)
MCH: 29.9 pg (ref 27.0–33.0)
MCHC: 33.9 g/dL (ref 32.0–36.0)
MCV: 88.3 fL (ref 80.0–100.0)
MPV: 9.8 fL (ref 7.5–12.5)
Monocytes Relative: 8.5 %
Neutro Abs: 4665 cells/uL (ref 1500–7800)
Neutrophils Relative %: 63.9 %
Platelets: 252 10*3/uL (ref 140–400)
RBC: 4.95 10*6/uL (ref 3.80–5.10)
RDW: 13 % (ref 11.0–15.0)
Total Lymphocyte: 24.3 %
WBC: 7.3 10*3/uL (ref 3.8–10.8)

## 2019-08-28 NOTE — Patient Instructions (Addendum)
I will send you a mychart message with your lab results You will get a call about scheduling your ultrasound  Cholelithiasis  Cholelithiasis is also called "gallstones." It is a kind of gallbladder disease. The gallbladder is an organ that stores a liquid (bile) that helps you digest fat. Gallstones may not cause symptoms (may be silent gallstones) until they cause a blockage, and then they can cause pain (gallbladder attack). Follow these instructions at home:  Take over-the-counter and prescription medicines only as told by your doctor.  Stay at a healthy weight.  Eat healthy foods. This includes: ? Eating fewer fatty foods, like fried foods. ? Eating fewer refined carbs (refined carbohydrates). Refined carbs are breads and grains that are highly processed, like white bread and white rice. Instead, choose whole grains like whole-wheat bread and brown rice. ? Eating more fiber. Almonds, fresh fruit, and beans are healthy sources of fiber.  Keep all follow-up visits as told by your doctor. This is important. Contact a doctor if:  You have sudden pain in the upper right side of your belly (abdomen). Pain might spread to your right shoulder or your chest. This may be a sign of a gallbladder attack.  You feel sick to your stomach (are nauseous).  You throw up (vomit).  You have been diagnosed with gallstones that have no symptoms and you get: ? Belly pain. ? Discomfort, burning, or fullness in the upper part of your belly (indigestion). Get help right away if:  You have sudden pain in the upper right side of your belly, and it lasts for more than 2 hours.  You have belly pain that lasts for more than 5 hours.  You have a fever or chills.  You keep feeling sick to your stomach or you keep throwing up.  Your skin or the whites of your eyes turn yellow (jaundice).  You have dark-colored pee (urine).  You have light-colored poop (stool). Summary  Cholelithiasis is also called  "gallstones."  The gallbladder is an organ that stores a liquid (bile) that helps you digest fat.  Silent gallstones are gallstones that do not cause symptoms.  A gallbladder attack may cause sudden pain in the upper right side of your belly. Pain might spread to your right shoulder or your chest. If this happens, contact your doctor.  If you have sudden pain in the upper right side of your belly that lasts for more than 2 hours, get help right away. This information is not intended to replace advice given to you by your health care provider. Make sure you discuss any questions you have with your health care provider. Document Revised: 06/14/2017 Document Reviewed: 03/18/2016 Elsevier Patient Education  2020 [employer].

## 2019-08-28 NOTE — Progress Notes (Signed)
Subjective:    Patient ID: Krystal Smith, female    DOB: December 24, 1960, 59 y.o.   MRN: [id number]  HPI Chief Complaint  Patient presents with  . Abdominal Discomfort    Pt has been experiencing pain in her right upper abd, radiating to her back, some loose stools. Pt concerned she either has an ulcer that is irritated, liver inflammation or possible gallstones. Pt states that at times she will have what feels like heartburn that sticks around for a long period of time. Sometimes having a BM will relieve the discomfort.     Has been going on for "awhile." Started a couple of years ago, 1-2 times a year. Now increased frequency, lasting longer. Now with episodes 1-2 times a month. Can last up to 6 hours. Unknown triggers. Little relief with tums, gas x. Some improvement with Miralax and subsequent bowel movement. Pain in upper epigastric area, last episode upper right quadrant to back. Rare nausea, no vomiting. Unable to identify triggers. Regular, daily BMs, no blood or mucus. No dysuria, frequency or hematuria.  Mother and brother have both required cholecystectomy.   Review of Systems Per HPI    Objective:   Physical Exam Vitals reviewed.  Constitutional:      General: She is not in acute distress.    Appearance: Normal appearance. She is obese. She is not ill-appearing, toxic-appearing or diaphoretic.  HENT:     Head: Normocephalic and atraumatic.  Eyes:     Conjunctiva/sclera: Conjunctivae normal.  Cardiovascular:     Rate and Rhythm: Normal rate and regular rhythm.     Heart sounds: Normal heart sounds.  Pulmonary:     Effort: Pulmonary effort is normal.     Breath sounds: Normal breath sounds.  Abdominal:     General: Abdomen is flat. Bowel sounds are normal. There is no distension.     Palpations: Abdomen is soft. There is no mass.     Tenderness: There is abdominal tenderness (generalized, worse R/LUQ. ). There is no guarding or rebound.     Hernia: No hernia is  present.  Musculoskeletal:     Cervical back: Normal range of motion and neck supple.     Right lower leg: No edema.     Left lower leg: No edema.  Skin:    General: Skin is warm and dry.  Neurological:     Mental Status: She is alert and oriented to person, place, and time.  Psychiatric:        Mood and Affect: Mood normal.        Behavior: Behavior normal.        Thought Content: Thought content normal.        Judgment: Judgment normal.       BP 128/80 (BP Location: Right Arm, Patient Position: Sitting, Cuff Size: Normal)   Pulse 80   Temp 97.9 F (36.6 C) (Temporal)   Ht 5\' 3"  (1.6 m)   Wt 180 lb 12.8 oz (82 kg)   SpO2 95%   BMI 32.03 kg/m  Wt Readings from Last 3 Encounters:  08/28/19 180 lb 12.8 oz (82 kg)  05/26/19 181 lb (82.1 kg)  04/18/18 175 lb 8 oz (79.6 kg)       Assessment & Plan:  1. Epigastric pain - ER precautions reviewed, will check labs and abdominal US. - Comprehensive metabolic panel - CBC with Differential - US Abdomen Complete; Future   Clarene Reamer, FNP-BC  [facility],  [facility]  08/28/2019 5:14 PM

## 2019-09-01 ENCOUNTER — Ambulatory Visit
Admission: RE | Admit: 2019-09-01 | Discharge: 2019-09-01 | Disposition: A | Discharge status: Home / Self Care | Payer: 59 | Source: Ambulatory Visit | Attending: Family Medicine | Admitting: Family Medicine

## 2019-09-01 DIAGNOSIS — R1013 Epigastric pain: Secondary | ICD-10-CM

## 2019-09-02 ENCOUNTER — Other Ambulatory Visit: Payer: Self-pay | Admitting: Family Medicine

## 2019-09-02 ENCOUNTER — Encounter: Payer: Self-pay | Admitting: Family Medicine

## 2019-09-02 DIAGNOSIS — K8020 Calculus of gallbladder without cholecystitis without obstruction: Secondary | ICD-10-CM

## 2019-10-15 HISTORY — PX: CHOLECYSTECTOMY: SHX55

## 2019-10-19 ENCOUNTER — Telehealth: Payer: Self-pay | Admitting: Certified Nurse Midwife

## 2019-10-19 NOTE — Telephone Encounter (Signed)
[employer] is calling to verify codes for the date of service for patient  Bone density scan. Please call [phone]

## 2019-10-21 ENCOUNTER — Telehealth: Payer: Self-pay

## 2019-10-21 NOTE — Telephone Encounter (Signed)
The insurance needs a corrected form completed for the diagnose code, for it to be routine, for the dexa scan

## 2019-10-22 ENCOUNTER — Other Ambulatory Visit: Payer: Self-pay | Admitting: Certified Nurse Midwife

## 2019-10-22 ENCOUNTER — Telehealth: Payer: Self-pay | Admitting: Certified Nurse Midwife

## 2019-10-22 DIAGNOSIS — Z13820 Encounter for screening for osteoporosis: Secondary | ICD-10-CM

## 2019-10-22 NOTE — Telephone Encounter (Signed)
[place] calling regarding the DEXA.  They state if this was preventive they would need new order with correct code resent to them.

## 2019-10-22 NOTE — Telephone Encounter (Signed)
done

## 2019-10-22 NOTE — Telephone Encounter (Signed)
I changed the code for her bone density to Z13.820 which she had done at [facility]. Can we call them to change the diagnostic code. I did mean for this to be a screening DEXA.

## 2019-10-27 NOTE — Telephone Encounter (Signed)
Krystal Smith, is calling to follow up on an order for patient's Dexa scan. She just needs to confirm the order if it is a preventive. Her direct number is [phone]

## 2019-10-28 NOTE — Telephone Encounter (Signed)
Spoke with coder at [facility] and advised that new screening code was sent to [facility] center last week. She will wait another week to see if she receives an updated claim, and if not, she will contact [place].

## 2019-11-06 ENCOUNTER — Telehealth: Payer: Self-pay

## 2019-11-06 NOTE — Telephone Encounter (Signed)
This is the lady who we already sent the screening code to [place] so they could change the code

## 2019-11-06 NOTE — Telephone Encounter (Signed)
Krystal Smith w/[employer] calling to f/u on request for diagnosis codes for Bone Density which was billed as diagnositc with diagnosis of symptomatic menopausal state. Patient states it was a routine screening/preventive. Insurance covers under screening, but diagnostic goes to deductible.

## 2020-03-04 ENCOUNTER — Other Ambulatory Visit: Payer: Self-pay | Admitting: Family Medicine

## 2020-03-04 DIAGNOSIS — J4520 Mild intermittent asthma, uncomplicated: Secondary | ICD-10-CM

## 2020-03-04 NOTE — Telephone Encounter (Signed)
Last office visit 08/28/2019 for epigastric pain.  Last refilled 03/14/2018 for 1 inhaler with 1 refill.  Last office visit that addressed asthma was 03/14/2018. No future appointments.  Ok to refill?

## 2020-03-16 ENCOUNTER — Other Ambulatory Visit: Payer: Self-pay | Admitting: Sleep Medicine

## 2020-03-16 ENCOUNTER — Other Ambulatory Visit: Payer: Self-pay

## 2020-03-16 DIAGNOSIS — I471 Supraventricular tachycardia: Secondary | ICD-10-CM

## 2020-03-18 LAB — NOVEL CORONAVIRUS, NAA: SARS-CoV-2, NAA: NOT DETECTED

## 2020-03-24 ENCOUNTER — Telehealth: Payer: Self-pay | Admitting: Family Medicine

## 2020-03-24 NOTE — Telephone Encounter (Signed)
Patient scheduled an appointment via mychart stating she has feels like she has the flu, but no fever. Expressed to be having SOB and fatigue getting tired with everything she does. Please triage as she set up appointment 9/13.

## 2020-03-24 NOTE — Telephone Encounter (Signed)
I spoke with pt; pt had scheduled a my chart in office appt on 03/28/20 at 9 Am with D Carlean Purl FNP;pt has SOB and fatigue for 6 months but getting worse, pt has chills on and off for 2 wks, pt last had diarrhea 2 days ago. Pt has not had CP for couple of months and has family hx of heart problems and pt said she is concerned she may be having heart issues.no other covid symptoms, no travel and no known exposure to + covid. Pt had moderna covid vaccines on 09/27/19 & 11/05/19. (immunization list updated). Pt said she does not have covid but with these symptoms cannot bring into office and a virtual cannot listen to pts heart or lungs and testing (EKG or CXR) cannot be done. Pt cancelled 03/28/20 appt and pt will go to [facility] in [place] or [place]. FYI to Glenda Chroman FNP.

## 2020-03-25 NOTE — Telephone Encounter (Signed)
I don't see any record of her going to urgent care. Please call and check on her.

## 2020-03-28 ENCOUNTER — Ambulatory Visit: Payer: BLUE CROSS/BLUE SHIELD | Admitting: Family Medicine

## 2020-03-28 NOTE — Telephone Encounter (Signed)
Called pt and she stated that she never went to UC.  Pt stated that she is feeling better. She also stated that if she start to feel worse, she will go to UC.

## 2020-04-20 DIAGNOSIS — F410 Panic disorder [episodic paroxysmal anxiety] without agoraphobia: Secondary | ICD-10-CM | POA: Diagnosis not present

## 2020-04-20 DIAGNOSIS — F5105 Insomnia due to other mental disorder: Secondary | ICD-10-CM | POA: Diagnosis not present

## 2020-04-20 DIAGNOSIS — F39 Unspecified mood [affective] disorder: Secondary | ICD-10-CM | POA: Diagnosis not present

## 2020-04-28 ENCOUNTER — Ambulatory Visit (INDEPENDENT_AMBULATORY_CARE_PROVIDER_SITE_OTHER): Payer: BC Managed Care – PPO | Admitting: Pulmonary Disease

## 2020-04-28 ENCOUNTER — Encounter: Payer: Self-pay | Admitting: Pulmonary Disease

## 2020-04-28 ENCOUNTER — Other Ambulatory Visit: Payer: Self-pay

## 2020-04-28 VITALS — BP 140/88 | HR 72 | Temp 98.0°F | Ht 63.0 in | Wt 188.0 lb

## 2020-04-28 DIAGNOSIS — R0600 Dyspnea, unspecified: Secondary | ICD-10-CM | POA: Diagnosis not present

## 2020-04-28 DIAGNOSIS — J4520 Mild intermittent asthma, uncomplicated: Secondary | ICD-10-CM | POA: Diagnosis not present

## 2020-04-28 DIAGNOSIS — E669 Obesity, unspecified: Secondary | ICD-10-CM

## 2020-04-28 DIAGNOSIS — R0602 Shortness of breath: Secondary | ICD-10-CM

## 2020-04-28 MED ORDER — ALBUTEROL SULFATE HFA 108 (90 BASE) MCG/ACT IN AERS: 2.0000 | INHALATION_SPRAY | Freq: Four times a day (QID) | RESPIRATORY_TRACT | 2 refills | Status: DC | PRN

## 2020-04-28 NOTE — Patient Instructions (Addendum)
We will order breathing tests   We are going to order a heart test (echocardiogram)  We will see you in follow-up in 2 to 3 months time call sooner should any new problems arise.  We will notify you of test results as they become available.

## 2020-04-28 NOTE — Progress Notes (Signed)
Subjective:    Patient ID: Krystal Smith, female    DOB: 1961-02-07, 59 y.o.   MRN: [id number]  HPI Patient is 59 year old remote former smoker (light issues, quit 1994) who presents as a self-referral wanting to change physicians.  She has had issues with shortness of breath for a number of years.  She had been previously followed by Dr. Wallene Huh at [facility].  She carries a diagnosis of mild intermittent asthma and "COPD".  Her exposures to cigarettes in the past was very light and sporadic.  She also carries a diagnosis of obstructive sleep apnea however is intolerant to CPAP.  She states that around December 2020 through January 2021 she had issues with her gallbladder and required cholecystectomy.  After that time she developed issues with increasing dyspnea particularly going up steps and exercising.  She also had gained significant amount of weight.  She had been quite sedentary.  She states that cooler weather and walking actually help her symptoms rescue inhaler when needed also helps her symptoms.  She has embarked in a weight loss program and this has also helped her symptoms.  When she is sedentary or exposed to hot weather her symptoms get worse.  She has not had any fevers, chills or sweats.  She does have occasional tachypalpitations.  Does not endorse any orthopnea or paroxysmal nocturnal dyspnea.  No lower extremity edema.  She does not endorse gastroesophageal reflux symptoms. She has not had any other symptomatology.   Patient had a history of pectus excavatum correction surgery when young.  DATA: 07/26/2015 PFTs: Performed at [facility] clinic, FEV1 1.85 L or 80% predicted, FVC 2.19 L or 79% predicted.  FEV1/FVC 84%.  Diffusion capacity normal.  Flow volume loop normal.  Essentially normal PFTs.  Review of Systems A 10 point review of systems was performed and it is as noted above otherwise negative.  Past Medical History:  Diagnosis Date  . Allergic  rhinitis   . Anxiety   . Asthma   . Bacterial pneumonia 12/2008   HOSP X2D  . Bipolar disorder ([place])   . BRCA negative 2013; 8/19   2013 BRCA neg; 2019 MyRisk neg except MSH3 VUS  . Depression   . Family history of breast cancer    mother age 24, mat aunt-two primary br cas in either breast-first cancer age 32, mgac breast and pancreatic  . Fibroadenoma of breast   . IBS (irritable bowel syndrome)   . Increased risk of breast cancer 02/2018   IBIS=24.7%/riskscore=37.6%  . Infertility   . Migraine   . Rosacea   . Vitamin D deficiency    Past Surgical History:  Procedure Laterality Date  . BREAST BIOPSY Right 2000   fibroadenoma, core biopsy.  Marland Kitchen BREAST BIOPSY Left 06-21-14   stereotatic, fibrocystic changes with microcalcifications. BRCA testing neagative  . BREAST CYST ASPIRATION Left 04/2006  . BREAST CYST ASPIRATION Right 2007  . CHOLECYSTECTOMY    . NASAL SINUS SURGERY  2004  . PECTUS EXCAVATUM REPAIR     age 42 at [facility]  . Repair left ankle fracture    . TONSILLECTOMY    . WISDOM TOOTH EXTRACTION     Family History  Problem Relation Age of Onset  . Breast cancer Mother 53  . Hypertension Mother   . Thyroid disease Mother   . Osteoporosis Mother   . Cancer Father        lymphoma; Squamous cell of face  . Dementia Father   .  Heart disease Father        MI AND CABG  . Breast cancer Maternal Aunt 50       Bilateral Cancer/Mastectomy  . Cancer Other        cancerous tumor on kidney  . Pancreatic cancer Other 59   Social History   Tobacco Use  . Smoking status: Former Smoker    Packs/day: 0.25    Types: Cigarettes    Quit date: 1994    Years since quitting: 27.8  . Smokeless tobacco: Never Used  . Tobacco comment: smoked 1-3 cigs/week  Substance Use Topics  . Alcohol use: Yes    Alcohol/week: 0.0 standard drinks    Comment: 0-1 BEER   No exotic pets in the home.  Has a small poodle.  Allergies  Allergen Reactions  . Amitriptyline Other (See  Comments)    Nervous ending pulse  . Amoxil [Amoxicillin]   . Corn-Containing Products     And other foods - diarrhea  . Depakote [Divalproex Sodium] Other (See Comments)    Uncontrolled muscle movement  . Erythromycin   . Imitrex [Sumatriptan]   . Skelaxin [Metaxalone]   . Ultracet [Tramadol-Acetaminophen] Itching  . Tetracyclines & Related Rash  . Venlafaxine Other (See Comments)    Night sweats.   Current Meds  Medication Sig  . aspirin-acetaminophen-caffeine (EXCEDRIN MIGRAINE) 250-250-65 MG tablet Take 1 tablet by mouth every 6 (six) hours as needed for headache.  . ibuprofen (ADVIL) 200 MG tablet Take 200 mg by mouth as needed.  . lamoTRIgine (LAMICTAL) 200 MG tablet Take 200 mg by mouth daily.   . Melatonin 3 MG TABS Take 12 mg by mouth at bedtime.  . sertraline (ZOLOFT) 50 MG tablet Take 1 tablet by mouth daily.  . traZODone (DESYREL) 100 MG tablet Take 1 tablet by mouth as needed.  . [DISCONTINUED] albuterol (VENTOLIN HFA) 108 (90 Base) MCG/ACT inhaler INHALE 2 PUFFS INTO THE LUNGS EVERY 4 HOURS AS NEEDED FOR WHEEZING SHORTNESS OF BREATH   Immunization History  Administered Date(s) Administered  . Moderna SARS-COVID-2 Vaccination 09/27/2019, 11/05/2019      Objective:   Physical Exam BP 140/88 (BP Location: Left Arm, Patient Position: Sitting, Cuff Size: Normal)   Pulse 72   Temp 98 F (36.7 C) (Temporal)   Ht $R'5\' 3"'uf$  (1.6 m)   Wt 188 lb (85.3 kg)   SpO2 91%   BMI 33.30 kg/m  GENERAL: Obese woman, fully ambulatory, no acute distress. HEAD: Normocephalic, atraumatic.  EYES: Pupils equal, round, reactive to light.  No scleral icterus.  Mild exophthalmos. MOUTH: Nose/mouth/throat not examined due to masking requirements for COVID 19. NECK: Thick neck.  Cannot make further assessment. PULMONARY: Good air entry bilaterally.  No adventitious sounds. CARDIOVASCULAR: S1 and S2. Regular rate and rhythm.  Grade 1-6/9 systolic ejection murmur left sternal border. ABDOMEN:  Benign.   MUSCULOSKELETAL: No joint deformity, no clubbing, no edema.  NEUROLOGIC: No deficit, no gait disturbance.  Speech is fluent. SKIN: Intact,warm,dry. PSYCH: Behavior normal.     Assessment & Plan:     ICD-10-CM   1. Dyspnea, unspecified type  R06.00 Pulmonary Function Test [facility] Only    ECHOCARDIOGRAM COMPLETE   PFTs as above We will also obtain 2D echo to evaluate for potential cardiac etiology Suspect related to obesity/deconditioning  2. Mild intermittent asthma without complication  C78.93    She appears to be fairly well controlled Will obtain PFTs to assess  Continue current regimen  3. Obesity (BMI 30.0-34.9)  E66.9    Weight loss is recommended   Orders Placed This Encounter  Procedures  . Pulmonary Function Test [facility] Only    Next available    Standing Status:   Future    Standing Expiration Date:   04/28/2021    Order Specific Question:   Full PFT: includes the following: basic spirometry, spirometry pre & post bronchodilator, diffusion capacity (DLCO), lung volumes    Answer:   Full PFT  . ECHOCARDIOGRAM COMPLETE    Standing Status:   Future    Standing Expiration Date:   04/28/2021    Order Specific Question:   Where should this test be performed    Answer:   [facility]    Order Specific Question:   Please indicate who you request to read the echo results.    Answer:   [facility]    Order Specific Question:   Perflutren DEFINITY (image enhancing agent) should be administered unless hypersensitivity or allergy exist    Answer:   Administer Perflutren    Order Specific Question:   Reason for exam-Echo    Answer:   Dyspnea  786.09 / R06.00    Meds ordered this encounter  Medications  . albuterol (VENTOLIN HFA) 108 (90 Base) MCG/ACT inhaler    Sig: Inhale 2 puffs into the lungs every 6 (six) hours as needed for wheezing or shortness of breath.    Dispense:  3 each    Refill:  2    Discussion:  Patient presents for the issue of dyspnea.   Previously evaluated at [facility].  Suspect most of her dyspnea issues are related to obesity and deconditioning.  She has had significant weight gain and had been sedentary after cholecystectomy.  Will obtain PFTs and echocardiogram as above.We will see the patient in 2 to 3 months time.  We will notify her of test results as they are known.  She is to contact us prior to follow-up visit should any new respiratory difficulties arise.  Renold Don, MD [facility]   *This note was dictated using voice recognition software/Dragon.  Despite best efforts to proofread, errors can occur which can change the meaning.  Any change was purely unintentional.

## 2020-05-13 DIAGNOSIS — D225 Melanocytic nevi of trunk: Secondary | ICD-10-CM | POA: Diagnosis not present

## 2020-05-13 DIAGNOSIS — L718 Other rosacea: Secondary | ICD-10-CM | POA: Diagnosis not present

## 2020-05-13 DIAGNOSIS — D2262 Melanocytic nevi of left upper limb, including shoulder: Secondary | ICD-10-CM | POA: Diagnosis not present

## 2020-05-13 DIAGNOSIS — D2261 Melanocytic nevi of right upper limb, including shoulder: Secondary | ICD-10-CM | POA: Diagnosis not present

## 2020-05-19 ENCOUNTER — Ambulatory Visit (INDEPENDENT_AMBULATORY_CARE_PROVIDER_SITE_OTHER): Payer: BC Managed Care – PPO

## 2020-05-19 ENCOUNTER — Other Ambulatory Visit: Payer: Self-pay

## 2020-05-19 DIAGNOSIS — R0600 Dyspnea, unspecified: Secondary | ICD-10-CM | POA: Diagnosis not present

## 2020-05-19 LAB — ECHOCARDIOGRAM COMPLETE
AR max vel: 2.22 cm2
AV Area VTI: 2.1 cm2
AV Area mean vel: 1.99 cm2
AV Mean grad: 3 mmHg
AV Peak grad: 5.9 mmHg
Ao pk vel: 1.21 m/s
Area-P 1/2: 4.29 cm2
S' Lateral: 2.2 cm

## 2020-05-19 MED ORDER — PERFLUTREN LIPID MICROSPHERE
1.0000 mL | INTRAVENOUS | Status: AC | PRN
  Administered 2020-05-19: 2 mL via INTRAVENOUS

## 2020-05-24 ENCOUNTER — Telehealth: Payer: Self-pay | Admitting: Pulmonary Disease

## 2020-05-24 NOTE — Telephone Encounter (Signed)
Tyler Pita, MD  Claudette Head A, CMA Echocardiogram looked good. Her function is good. No increase pressure from the artery going from the heart to the lungs.    Lm for patient.

## 2020-05-25 NOTE — Telephone Encounter (Signed)
Patient is aware of results and voiced her understanding.  Nothing further needed.   

## 2020-07-26 ENCOUNTER — Telehealth: Payer: Self-pay | Admitting: Pulmonary Disease

## 2020-07-26 NOTE — Telephone Encounter (Signed)
Patient is calling to schedule PFT. PFT was ordered on 04/28/2020. She is aware that Rodena Piety will contact her to schedule PFT.   Rodena Piety, please advise. thanks

## 2020-07-26 NOTE — Telephone Encounter (Signed)
I have spoke with Krystal Smith and her Covid Test has been scheduled on 08/01/2020 and PFT has been scheduled on 08/02/2020 @ 1:30pm I have mailed her the PFT instructions

## 2020-07-29 ENCOUNTER — Telehealth: Payer: Self-pay

## 2020-07-29 NOTE — Telephone Encounter (Signed)
Patient is aware of date/time of covid test.   

## 2020-07-29 NOTE — Telephone Encounter (Signed)
Lm to relay date/time of covid test prior to PFT.  [facility]

## 2020-08-01 ENCOUNTER — Other Ambulatory Visit: Payer: Self-pay

## 2020-08-01 ENCOUNTER — Other Ambulatory Visit
Admission: RE | Admit: 2020-08-01 | Discharge: 2020-08-01 | Disposition: A | Discharge status: Home / Self Care | Payer: BC Managed Care – PPO | Source: Ambulatory Visit | Attending: Pulmonary Disease | Admitting: Pulmonary Disease

## 2020-08-01 DIAGNOSIS — Z01812 Encounter for preprocedural laboratory examination: Secondary | ICD-10-CM | POA: Insufficient documentation

## 2020-08-01 DIAGNOSIS — Z20822 Contact with and (suspected) exposure to covid-19: Secondary | ICD-10-CM | POA: Diagnosis not present

## 2020-08-01 LAB — SARS CORONAVIRUS 2 (TAT 6-24 HRS): SARS Coronavirus 2: NEGATIVE

## 2020-08-02 ENCOUNTER — Other Ambulatory Visit: Payer: Self-pay

## 2020-08-02 ENCOUNTER — Ambulatory Visit: Payer: BC Managed Care – PPO | Attending: Pulmonary Disease

## 2020-08-02 DIAGNOSIS — R0600 Dyspnea, unspecified: Secondary | ICD-10-CM | POA: Diagnosis not present

## 2020-08-02 DIAGNOSIS — J984 Other disorders of lung: Secondary | ICD-10-CM | POA: Insufficient documentation

## 2020-08-03 LAB — PULMONARY FUNCTION TEST ARMC ONLY
DL/VA % pred: 121 %
DL/VA: 5.2 ml/min/mmHg/L
DLCO unc % pred: 78 %
DLCO unc: 15.5 ml/min/mmHg
FEF 25-75 Post: 2.95 L/sec
FEF 25-75 Pre: 2.79 L/sec
FEF2575-%Change-Post: 5 %
FEF2575-%Pred-Post: 123 %
FEF2575-%Pred-Pre: 117 %
FEV1-%Change-Post: 0 %
FEV1-%Pred-Post: 71 %
FEV1-%Pred-Pre: 71 %
FEV1-Post: 1.81 L
FEV1-Pre: 1.79 L
FEV1FVC-%Change-Post: 0 %
FEV1FVC-%Pred-Pre: 115 %
FEV6-%Change-Post: 0 %
FEV6-%Pred-Post: 62 %
FEV6-%Pred-Pre: 62 %
FEV6-Post: 1.96 L
FEV6-Pre: 1.97 L
FEV6FVC-%Pred-Post: 103 %
FEV6FVC-%Pred-Pre: 103 %
FVC-%Change-Post: 0 %
FVC-%Pred-Post: 60 %
FVC-%Pred-Pre: 60 %
FVC-Post: 1.97 L
Post FEV1/FVC ratio: 92 %
Post FEV6/FVC ratio: 100 %
Pre FEV1/FVC ratio: 91 %
Pre FEV6/FVC Ratio: 100 %
RV % pred: 60 %
RV: 1.15 L
TLC % pred: 64 %
TLC: 3.18 L

## 2020-08-17 DIAGNOSIS — F39 Unspecified mood [affective] disorder: Secondary | ICD-10-CM | POA: Diagnosis not present

## 2020-08-17 DIAGNOSIS — F410 Panic disorder [episodic paroxysmal anxiety] without agoraphobia: Secondary | ICD-10-CM | POA: Diagnosis not present

## 2020-08-17 DIAGNOSIS — F5105 Insomnia due to other mental disorder: Secondary | ICD-10-CM | POA: Diagnosis not present

## 2020-08-25 ENCOUNTER — Other Ambulatory Visit
Admission: RE | Admit: 2020-08-25 | Discharge: 2020-08-25 | Disposition: A | Discharge status: Home / Self Care | Payer: BC Managed Care – PPO | Source: Ambulatory Visit | Attending: Pulmonary Disease | Admitting: Pulmonary Disease

## 2020-08-25 ENCOUNTER — Ambulatory Visit: Payer: BC Managed Care – PPO | Admitting: Pulmonary Disease

## 2020-08-25 ENCOUNTER — Other Ambulatory Visit: Payer: Self-pay

## 2020-08-25 ENCOUNTER — Encounter: Payer: Self-pay | Admitting: Pulmonary Disease

## 2020-08-25 VITALS — BP 134/78 | HR 80 | Temp 97.1°F | Ht 63.0 in | Wt 189.4 lb

## 2020-08-25 DIAGNOSIS — R5383 Other fatigue: Secondary | ICD-10-CM | POA: Insufficient documentation

## 2020-08-25 DIAGNOSIS — G4733 Obstructive sleep apnea (adult) (pediatric): Secondary | ICD-10-CM | POA: Diagnosis not present

## 2020-08-25 DIAGNOSIS — E669 Obesity, unspecified: Secondary | ICD-10-CM | POA: Diagnosis not present

## 2020-08-25 DIAGNOSIS — R0600 Dyspnea, unspecified: Secondary | ICD-10-CM

## 2020-08-25 LAB — T4, FREE: Free T4: 0.65 ng/dL (ref 0.61–1.12)

## 2020-08-25 LAB — CORTISOL: Cortisol, Plasma: 5.1 ug/dL

## 2020-08-25 LAB — TSH: TSH: 2.411 u[IU]/mL (ref 0.350–4.500)

## 2020-08-25 NOTE — Progress Notes (Signed)
Subjective:    Patient ID: Krystal Smith, female    DOB: 1960/10/25, 60 y.o.   MRN: [id number]  Chief Complaint  Patient presents with  . Follow-up    Review PFT and echo--breathing is up and down. C/o sob with exertion and wheezing.    HPI This is a 60 year old very remote former smoker (quit 1994, minimal use) who follows here for the issue of shortness of breath.  She was initially evaluated 28 April 2020 please refer to that note for details.  Time we ordered an echocardiogram to evaluate for potential pulmonary hypertension, this failed to show pulmonary hypertension.  Her LVEF is 60 to 01%, no diastolic dysfunction noted.  No valvular dysfunction.  PFTs did not show obstructive lung disease.  She has pseudo restriction due to obesity with ERV being 30%.  Diffusion capacity (Kco) normal.  She continues to embark in weight loss and continues to feel that her breathing is slowly getting better she states that it is "up-and-down".  She does have a prior diagnosis of asthma but does not require maintenance inhalers.  Her PFTs did not suggest that maintenance inhalers are necessary.  DATA: 07/26/2015 PFTs: Performed at [facility] clinic, FEV1 1.85 L or 80% predicted, FVC 2.19 L or 79% predicted.  FEV1/FVC 84%.  Diffusion capacity normal.  Flow volume loop normal.  Essentially normal PFTs. 05/20/2019 one 2D echo: LVEF 60 to 60%, no diastolic dysfunction. 08/02/2020 PFTs: FEV1 1.79 L or 71% predicted, FVC 1.97 L or 60% predicted, FEV1/FVC 91%, ERV 30%, diminished.  Flow volume loop normal.  Diffusion capacity normal.  Consistent with pseudo restriction due to obesity.  Review of Systems A 10 point review of systems was performed and it is as noted above otherwise negative.  Patient Active Problem List   Diagnosis Date Noted  . Increased risk of breast cancer 04/20/2018  . Prediabetes 03/15/2018  . Rosacea   . Vitamin D deficiency   . Migraine   . IBS (irritable bowel syndrome)    . Family history of breast cancer   . Depression   . Bipolar disorder ([place])   . Asthma   . Anxiety   . Allergic rhinitis   . Hematoma complicating a procedure 06/22/2014  . Breast microcalcification, mammographic 11/26/2013   Social History   Tobacco Use  . Smoking status: Former Smoker    Packs/day: 0.00    Years: 10.00    Pack years: 0.00    Types: Cigarettes    Quit date: 1994    Years since quitting: 28.3  . Smokeless tobacco: Never Used  . Tobacco comment: 5-7 cigs weekly--08/25/2020  Substance Use Topics  . Alcohol use: Yes    Alcohol/week: 0.0 standard drinks    Comment: 0-1 BEER   Allergies  Allergen Reactions  . Amitriptyline Other (See Comments)    Nervous ending pulse  . Amoxil [Amoxicillin]   . Corn-Containing Products     And other foods - diarrhea  . Depakote [Divalproex Sodium] Other (See Comments)    Uncontrolled muscle movement  . Erythromycin   . Imitrex [Sumatriptan]   . Skelaxin [Metaxalone]   . Ultracet [Tramadol-Acetaminophen] Itching  . Tetracyclines & Related Rash  . Venlafaxine Other (See Comments)    Night sweats.   Current Meds  Medication Sig  . albuterol (VENTOLIN HFA) 108 (90 Base) MCG/ACT inhaler Inhale 2 puffs into the lungs every 6 (six) hours as needed for wheezing or shortness of breath.  Marland Kitchen aspirin-acetaminophen-caffeine ([place]) [phone]  MG tablet Take 1 tablet by mouth every 6 (six) hours as needed for headache.  . ibuprofen (ADVIL) 200 MG tablet Take 200 mg by mouth as needed.  . lamoTRIgine (LAMICTAL) 200 MG tablet Take 200 mg by mouth daily.   . Melatonin 3 MG TABS Take 12 mg by mouth at bedtime.  . sertraline (ZOLOFT) 50 MG tablet Take 2 tablets by mouth daily.  . traZODone (DESYREL) 100 MG tablet Take 1 tablet by mouth as needed.   Immunization History  Administered Date(s) Administered  . Moderna Sars-Covid-2 Vaccination 09/27/2019, 11/05/2019      Objective:   Physical Exam BP 134/78 (BP Location:  Left Arm, Cuff Size: Normal)   Pulse 80   Temp (!) 97.1 F (36.2 C) (Temporal)   Ht 5\' 3"  (1.6 m)   Wt 189 lb 6.4 oz (85.9 kg)   SpO2 96%   BMI 33.55 kg/m  GENERAL: Obese woman, fully ambulatory, no acute distress. HEAD: Normocephalic, atraumatic.  EYES: Pupils equal, round, reactive to light.  No scleral icterus.  Mild exophthalmos. MOUTH: Nose/mouth/throat not examined due to masking requirements for COVID 19. NECK: Thick neck.  Cannot make further assessment. PULMONARY: Good air entry bilaterally.  No adventitious sounds. CARDIOVASCULAR: S1 and S2. Regular rate and rhythm.  Grade 5-0/9 systolic ejection murmur left sternal border. ABDOMEN: Benign.   MUSCULOSKELETAL: No joint deformity, no clubbing, no edema.  NEUROLOGIC: No deficit, no gait disturbance.  Speech is fluent. SKIN: Intact,warm,dry. PSYCH: Behavior normal.     Assessment & Plan:     ICD-10-CM   1. Dyspnea, unspecified type  R06.00    No evidence of obstructive lung disease on PFTs Restriction likely due to obesity (ERV 30% No evidence of small airways dysfunction Doubt asthma  2. Other fatigue  R53.83 Cortisol    TSH    T4, free   Check TSH, free T4 and cortisol levels  3. Obesity (BMI 30.0-34.9)  E66.9    Weight loss recommended  4. OSA (obstructive sleep apnea)  G47.33    Intolerant to CPAP Managed by neurology   Orders Placed This Encounter  Procedures  . Cortisol    Standing Status:   Future    Number of Occurrences:   1    Standing Expiration Date:   08/25/2021  . TSH    Standing Status:   Future    Number of Occurrences:   1    Standing Expiration Date:   08/25/2021  . T4, free    Standing Status:   Future    Number of Occurrences:   1    Standing Expiration Date:   08/25/2021   We will see the patient in follow-up in 4 months time she is to contact us prior to that time should any new difficulties arise.  Renold Don, MD [facility]   *This note was dictated using voice  recognition software/Dragon.  Despite best efforts to proofread, errors can occur which can change the meaning.  Any change was purely unintentional.

## 2020-08-25 NOTE — Patient Instructions (Signed)
We are going to get thyroid test and cortisol test (blood tests) this will tell us about your thyroid and adrenal glands.  Weight loss and increase exercise as tolerated is encouraged.  We will see you in follow-up in 4 months time to follow-up on your progress, call sooner should any new problems arise.  We will call you with the results of your blood tests.

## 2020-09-06 DIAGNOSIS — F410 Panic disorder [episodic paroxysmal anxiety] without agoraphobia: Secondary | ICD-10-CM | POA: Diagnosis not present

## 2020-09-06 DIAGNOSIS — F5105 Insomnia due to other mental disorder: Secondary | ICD-10-CM | POA: Diagnosis not present

## 2020-09-06 DIAGNOSIS — F39 Unspecified mood [affective] disorder: Secondary | ICD-10-CM | POA: Diagnosis not present

## 2020-10-19 DIAGNOSIS — M170 Bilateral primary osteoarthritis of knee: Secondary | ICD-10-CM | POA: Diagnosis not present

## 2020-10-19 DIAGNOSIS — M25562 Pain in left knee: Secondary | ICD-10-CM | POA: Diagnosis not present

## 2020-10-19 DIAGNOSIS — M25561 Pain in right knee: Secondary | ICD-10-CM | POA: Diagnosis not present

## 2020-10-27 DIAGNOSIS — F39 Unspecified mood [affective] disorder: Secondary | ICD-10-CM | POA: Diagnosis not present

## 2020-10-27 DIAGNOSIS — F410 Panic disorder [episodic paroxysmal anxiety] without agoraphobia: Secondary | ICD-10-CM | POA: Diagnosis not present

## 2020-10-27 DIAGNOSIS — F5105 Insomnia due to other mental disorder: Secondary | ICD-10-CM | POA: Diagnosis not present

## 2021-01-23 DIAGNOSIS — F410 Panic disorder [episodic paroxysmal anxiety] without agoraphobia: Secondary | ICD-10-CM | POA: Diagnosis not present

## 2021-01-23 DIAGNOSIS — F5105 Insomnia due to other mental disorder: Secondary | ICD-10-CM | POA: Diagnosis not present

## 2021-01-23 DIAGNOSIS — F39 Unspecified mood [affective] disorder: Secondary | ICD-10-CM | POA: Diagnosis not present

## 2021-05-12 DIAGNOSIS — D2262 Melanocytic nevi of left upper limb, including shoulder: Secondary | ICD-10-CM | POA: Diagnosis not present

## 2021-05-12 DIAGNOSIS — L718 Other rosacea: Secondary | ICD-10-CM | POA: Diagnosis not present

## 2021-05-12 DIAGNOSIS — L732 Hidradenitis suppurativa: Secondary | ICD-10-CM | POA: Diagnosis not present

## 2021-05-12 DIAGNOSIS — D225 Melanocytic nevi of trunk: Secondary | ICD-10-CM | POA: Diagnosis not present

## 2021-05-24 DIAGNOSIS — F39 Unspecified mood [affective] disorder: Secondary | ICD-10-CM | POA: Diagnosis not present

## 2021-05-24 DIAGNOSIS — F5105 Insomnia due to other mental disorder: Secondary | ICD-10-CM | POA: Diagnosis not present

## 2021-05-24 DIAGNOSIS — F410 Panic disorder [episodic paroxysmal anxiety] without agoraphobia: Secondary | ICD-10-CM | POA: Diagnosis not present

## 2021-08-21 DIAGNOSIS — F5105 Insomnia due to other mental disorder: Secondary | ICD-10-CM | POA: Diagnosis not present

## 2021-08-21 DIAGNOSIS — F410 Panic disorder [episodic paroxysmal anxiety] without agoraphobia: Secondary | ICD-10-CM | POA: Diagnosis not present

## 2021-08-21 DIAGNOSIS — F39 Unspecified mood [affective] disorder: Secondary | ICD-10-CM | POA: Diagnosis not present

## 2021-10-02 ENCOUNTER — Ambulatory Visit: Payer: 59 | Admitting: Obstetrics and Gynecology

## 2021-10-24 ENCOUNTER — Ambulatory Visit (INDEPENDENT_AMBULATORY_CARE_PROVIDER_SITE_OTHER): Payer: BC Managed Care – PPO | Admitting: Family Medicine

## 2021-10-24 ENCOUNTER — Other Ambulatory Visit (HOSPITAL_COMMUNITY)
Admission: RE | Admit: 2021-10-24 | Discharge: 2021-10-24 | Disposition: A | Discharge status: Home / Self Care | Payer: BC Managed Care – PPO | Source: Ambulatory Visit | Attending: Obstetrics and Gynecology | Admitting: Obstetrics and Gynecology

## 2021-10-24 ENCOUNTER — Encounter: Payer: Self-pay | Admitting: Family Medicine

## 2021-10-24 VITALS — BP 120/80 | Ht 63.0 in | Wt 189.0 lb

## 2021-10-24 DIAGNOSIS — R7303 Prediabetes: Secondary | ICD-10-CM

## 2021-10-24 DIAGNOSIS — Z124 Encounter for screening for malignant neoplasm of cervix: Secondary | ICD-10-CM

## 2021-10-24 DIAGNOSIS — Z01419 Encounter for gynecological examination (general) (routine) without abnormal findings: Secondary | ICD-10-CM | POA: Insufficient documentation

## 2021-10-24 DIAGNOSIS — Z9189 Other specified personal risk factors, not elsewhere classified: Secondary | ICD-10-CM

## 2021-10-24 DIAGNOSIS — Z803 Family history of malignant neoplasm of breast: Secondary | ICD-10-CM

## 2021-10-24 DIAGNOSIS — N898 Other specified noninflammatory disorders of vagina: Secondary | ICD-10-CM | POA: Insufficient documentation

## 2021-10-24 NOTE — Progress Notes (Signed)
? ? ?  GYNECOLOGY ANNUAL PREVENTATIVE CARE ENCOUNTER NOTE ? ?Subjective:  ? Krystal Smith is a 61 y.o. G0P0 female here for a routine annual gynecologic exam.  Current complaints: reports vaginal dryness and itching occasionally.  ? ?Denies abnormal vaginal bleeding, discharge, pelvic pain, problems with intercourse or other gynecologic concerns.  ?  ?Gynecologic History ?No LMP recorded. Patient is postmenopausal. ?Contraception: post menopausal status ?Last Pap: 2020. Results were: normal ?Last mammogram: 2020. Results were: normal ? ?Health Maintenance Due  ?Topic Date Due  ? HIV Screening  Never done  ? Hepatitis C Screening  Never done  ? TETANUS/TDAP  Never done  ? Zoster Vaccines- Shingrix (1 of 2) Never done  ? COVID-19 Vaccine (3 - Booster for Moderna series) 12/31/2019  ? MAMMOGRAM  04/30/2021  ? ? ?The following portions of the patient's history were reviewed and updated as appropriate: allergies, current medications, past family history, past medical history, past social history, past surgical history and problem list. ? ?Review of Systems ?Pertinent items are noted in HPI. ?  ?Objective:  ?BP 120/80   Ht '5\' 3"'$  (1.6 m)   Wt 189 lb (85.7 kg)   BMI 33.48 kg/m?  ?CONSTITUTIONAL: Well-developed, well-nourished female in no acute distress.  ?HENT:  Normocephalic, atraumatic, External right and left ear normal. Oropharynx is clear and moist ?EYES:  No scleral icterus.  ?NECK: Normal range of motion, supple, no masses.  Normal thyroid.  ?SKIN: Skin is warm and dry. No rash noted. Not diaphoretic. No erythema. No pallor. ?NEUROLOGIC: Alert and oriented to person, place, and time. Normal reflexes, muscle tone coordination. No cranial nerve deficit noted. ?PSYCHIATRIC: Normal mood and affect. Normal behavior. Normal judgment and thought content. ?CARDIOVASCULAR: Normal heart rate noted, regular rhythm. 2+ distal pulses. ?RESPIRATORY: Effort and breath sounds normal, no problems with respiration  noted. ?BREASTS: Symmetric in size. No masses, skin changes, nipple drainage, or lymphadenopathy. ?ABDOMEN: Soft,  no distention noted.  No tenderness, rebound or guarding.  ?PELVIC: Normal appearing external genitalia; Mildly atrophic vaginal mucosa and cervix. No abnormal discharge noted.  Pap smear obtained.  Normal uterine size, no other palpable masses, no uterine or adnexal tenderness. ?MUSCULOSKELETAL: Normal range of motion.  ? ?Assessment and Plan:  ?1) Annual gynecologic examination with pap smear:  Will follow up results of pap smear and manage accordingly. STI screening desired--No.  Routine preventative health maintenance measures emphasized. Reviewed perimenopausal symptoms and management.  ? ?1. Well woman exam with routine gynecological exam ?- IGP, Aptima HPV, rfx 16/18,45 ?- Cervicovaginal ancillary only ? ?2. Increased risk of breast cancer ?- MM 3D SCREEN BREAST BILATERAL; Future ? ?3. Prediabetes ? ?4. Family history of breast cancer ?- MM 3D SCREEN BREAST BILATERAL; Future ? ?5. Cervical cancer screening ?- IGP, Aptima HPV, rfx 16/18,45 ? ?6. Vaginal itching ?- Cervicovaginal ancillary only ? ? ?Please refer to After Visit Summary for other counseling recommendations.  ? ?Return in about 1 year (around 10/25/2022) for Yearly wellness exam. ? ?Caren Macadam, MD, MPH, ABFM ?Attending Physician ?[facility] [place]  ?

## 2021-10-26 LAB — CERVICOVAGINAL ANCILLARY ONLY
Bacterial Vaginitis (gardnerella): NEGATIVE
Candida Glabrata: NEGATIVE
Candida Vaginitis: NEGATIVE
Comment: NEGATIVE
Comment: NEGATIVE
Comment: NEGATIVE

## 2021-10-27 ENCOUNTER — Other Ambulatory Visit: Payer: Self-pay | Admitting: Family Medicine

## 2021-10-27 DIAGNOSIS — N6311 Unspecified lump in the right breast, upper outer quadrant: Secondary | ICD-10-CM

## 2021-10-27 NOTE — Progress Notes (Signed)
Called pt no answer °

## 2021-10-29 LAB — IGP, APTIMA HPV, RFX 16/18,45: HPV Aptima: NEGATIVE

## 2021-10-30 NOTE — Progress Notes (Signed)
Pt aware.

## 2021-11-13 ENCOUNTER — Inpatient Hospital Stay: Admission: RE | Admit: 2021-11-13 | Payer: BC Managed Care – PPO | Source: Ambulatory Visit

## 2021-11-13 ENCOUNTER — Other Ambulatory Visit: Payer: BC Managed Care – PPO

## 2021-11-14 DIAGNOSIS — F39 Unspecified mood [affective] disorder: Secondary | ICD-10-CM | POA: Diagnosis not present

## 2021-11-14 DIAGNOSIS — F410 Panic disorder [episodic paroxysmal anxiety] without agoraphobia: Secondary | ICD-10-CM | POA: Diagnosis not present

## 2021-11-14 DIAGNOSIS — F5105 Insomnia due to other mental disorder: Secondary | ICD-10-CM | POA: Diagnosis not present

## 2021-11-27 ENCOUNTER — Ambulatory Visit
Admission: RE | Admit: 2021-11-27 | Discharge: 2021-11-27 | Disposition: A | Discharge status: Home / Self Care | Payer: BC Managed Care – PPO | Source: Ambulatory Visit | Attending: Family Medicine | Admitting: Family Medicine

## 2021-11-27 DIAGNOSIS — R928 Other abnormal and inconclusive findings on diagnostic imaging of breast: Secondary | ICD-10-CM | POA: Diagnosis not present

## 2021-11-27 DIAGNOSIS — N6311 Unspecified lump in the right breast, upper outer quadrant: Secondary | ICD-10-CM | POA: Insufficient documentation

## 2021-11-27 DIAGNOSIS — Z803 Family history of malignant neoplasm of breast: Secondary | ICD-10-CM | POA: Diagnosis not present

## 2021-12-05 DIAGNOSIS — F410 Panic disorder [episodic paroxysmal anxiety] without agoraphobia: Secondary | ICD-10-CM | POA: Diagnosis not present

## 2021-12-05 DIAGNOSIS — F5105 Insomnia due to other mental disorder: Secondary | ICD-10-CM | POA: Diagnosis not present

## 2021-12-05 DIAGNOSIS — F39 Unspecified mood [affective] disorder: Secondary | ICD-10-CM | POA: Diagnosis not present

## 2022-01-09 DIAGNOSIS — F5105 Insomnia due to other mental disorder: Secondary | ICD-10-CM | POA: Diagnosis not present

## 2022-01-09 DIAGNOSIS — F410 Panic disorder [episodic paroxysmal anxiety] without agoraphobia: Secondary | ICD-10-CM | POA: Diagnosis not present

## 2022-01-09 DIAGNOSIS — F39 Unspecified mood [affective] disorder: Secondary | ICD-10-CM | POA: Diagnosis not present

## 2022-02-09 DIAGNOSIS — M170 Bilateral primary osteoarthritis of knee: Secondary | ICD-10-CM | POA: Diagnosis not present

## 2022-03-15 ENCOUNTER — Telehealth: Payer: Self-pay | Admitting: Pulmonary Disease

## 2022-03-15 DIAGNOSIS — F5105 Insomnia due to other mental disorder: Secondary | ICD-10-CM | POA: Diagnosis not present

## 2022-03-15 DIAGNOSIS — F39 Unspecified mood [affective] disorder: Secondary | ICD-10-CM | POA: Diagnosis not present

## 2022-03-15 DIAGNOSIS — F410 Panic disorder [episodic paroxysmal anxiety] without agoraphobia: Secondary | ICD-10-CM | POA: Diagnosis not present

## 2022-03-15 MED ORDER — ALBUTEROL SULFATE HFA 108 (90 BASE) MCG/ACT IN AERS: 2.0000 | INHALATION_SPRAY | Freq: Four times a day (QID) | RESPIRATORY_TRACT | 0 refills | Status: DC | PRN

## 2022-03-15 NOTE — Telephone Encounter (Signed)
One month supply of ventolin has been sent to preferred pharmacy.  Appt scheduled 05/09/2022 at 10:30, as patient is past due. Patient aware and voiced her understanding.  Nothing further needed.

## 2022-05-14 ENCOUNTER — Ambulatory Visit: Payer: BC Managed Care – PPO | Admitting: Pulmonary Disease

## 2022-05-14 ENCOUNTER — Encounter: Payer: Self-pay | Admitting: Pulmonary Disease

## 2022-05-14 VITALS — BP 122/78 | HR 68 | Temp 98.7°F | Ht 63.0 in | Wt 180.2 lb

## 2022-05-14 DIAGNOSIS — D225 Melanocytic nevi of trunk: Secondary | ICD-10-CM | POA: Diagnosis not present

## 2022-05-14 DIAGNOSIS — R0600 Dyspnea, unspecified: Secondary | ICD-10-CM | POA: Diagnosis not present

## 2022-05-14 DIAGNOSIS — D2261 Melanocytic nevi of right upper limb, including shoulder: Secondary | ICD-10-CM | POA: Diagnosis not present

## 2022-05-14 DIAGNOSIS — Z7689 Persons encountering health services in other specified circumstances: Secondary | ICD-10-CM

## 2022-05-14 DIAGNOSIS — D2272 Melanocytic nevi of left lower limb, including hip: Secondary | ICD-10-CM | POA: Diagnosis not present

## 2022-05-14 DIAGNOSIS — D2262 Melanocytic nevi of left upper limb, including shoulder: Secondary | ICD-10-CM | POA: Diagnosis not present

## 2022-05-14 LAB — NITRIC OXIDE: Nitric Oxide: 14

## 2022-05-14 MED ORDER — ALBUTEROL SULFATE HFA 108 (90 BASE) MCG/ACT IN AERS: 2.0000 | INHALATION_SPRAY | Freq: Four times a day (QID) | RESPIRATORY_TRACT | 0 refills | Status: DC | PRN

## 2022-05-14 NOTE — Progress Notes (Signed)
Can  Subjective:    Patient ID: Krystal Smith, female    DOB: May 16, 1961, 61 y.o.   MRN: [id number] Patient Care Team: System, Provider Not In as PCP - General Nadara Mustard, MD as Referring Physician (Obstetrics and Gynecology) Lemar Livings Merrily Pew, MD (General Surgery)  Chief Complaint  Patient presents with   Follow-up    SOB is better. No wheezing or cough.    HPI This is a 61 year old very remote former smoker (quit 1994, minimal use) who follows here for the issue of shortness of breath.  She was initially evaluated 28 April 2020 at that time we ordered an echocardiogram to evaluate for potential pulmonary hypertension, this failed to show pulmonary hypertension.  Her LVEF is 60 to 65%, no diastolic dysfunction noted.  No valvular dysfunction.  PFTs did not show obstructive lung disease.  She has pseudo restriction due to obesity with ERV being 30%.  Diffusion capacity corrected by alveolar volume is normal.  She continues to embark in weight loss and continues to feel that her breathing is slowly getting better she states that it is "up-and-down".  She does have a prior diagnosis of asthma but does not require maintenance inhalers.  Her PFTs did not suggest that maintenance inhalers are necessary.  She has been diagnosed with obstructive sleep apnea in the past however is intolerant of CPAP.  She follows with neurology for this issue.  DATA: 07/26/2015 PFTs: Performed at [facility] clinic, FEV1 1.85 L or 80% predicted, FVC 2.19 L or 79% predicted.  FEV1/FVC 84%.  Diffusion capacity normal.  Flow volume loop normal.  Essentially normal PFTs. 05/20/2019 one 2D echo: LVEF 60 to 65%, no diastolic dysfunction. 08/02/2020 PFTs: FEV1 1.79 L or 71% predicted, FVC 1.97 L or 60% predicted, FEV1/FVC 91%, ERV 30%, diminished.  Flow volume loop normal.  Diffusion capacity normal.  Consistent with pseudo restriction due to obesity.  Review of Systems A 10 point review of systems was  performed and it is as noted above otherwise negative.  Patient Active Problem List   Diagnosis Date Noted   Increased risk of breast cancer 04/20/2018   Prediabetes 03/15/2018   Rosacea    Vitamin D deficiency    Migraine    IBS (irritable bowel syndrome)    Family history of breast cancer    Depression    Bipolar disorder (HCC)    Anxiety    Allergic rhinitis    Hematoma complicating a procedure 06/22/2014   Breast microcalcification, mammographic 11/26/2013   Social History   Tobacco Use   Smoking status: Former    Packs/day: 0.00    Years: 10.00    Total pack years: 0.00    Types: Cigarettes    Quit date: 1994    Years since quitting: 29.8   Smokeless tobacco: Never   Tobacco comments:    Quit in 1994- 05/14/2022- khj  patient states she has not smoked in 30 years  Substance Use Topics   Alcohol use: Yes    Alcohol/week: 0.0 standard drinks of alcohol    Comment: 0-1 BEER   Allergies  Allergen Reactions   Amitriptyline Other (See Comments)    Nervous ending pulse   Amoxil [Amoxicillin]    Corn-Containing Products     And other foods - diarrhea   Depakote [Divalproex Sodium] Other (See Comments)    Uncontrolled muscle movement   Erythromycin    Ultracet [Tramadol-Acetaminophen] Itching   Imitrex [Sumatriptan] Palpitations    Worse migraine  Skelaxin [Metaxalone] Palpitations    migraine   Tetracyclines & Related Rash   Venlafaxine Other (See Comments)    Night sweats.   Current Meds  Medication Sig   aspirin-acetaminophen-caffeine (EXCEDRIN MIGRAINE) 250-250-65 MG tablet Take 1 tablet by mouth every 6 (six) hours as needed for headache.   ibuprofen (ADVIL) 200 MG tablet Take 200 mg by mouth as needed.   Ivermectin (SOOLANTRA) 1 % CREA Apply topically daily.   lamoTRIgine (LAMICTAL) 200 MG tablet Take 200 mg by mouth daily.    Melatonin 3 MG TABS Take 12 mg by mouth as needed.   ORACEA 40 MG capsule Take 40 mg by mouth as needed.   sertraline (ZOLOFT)  50 MG tablet Take 2 tablets by mouth daily.   traZODone (DESYREL) 100 MG tablet Take 1 tablet by mouth as needed.   [DISCONTINUED] albuterol (VENTOLIN HFA) 108 (90 Base) MCG/ACT inhaler Inhale 2 puffs into the lungs every 6 (six) hours as needed for wheezing or shortness of breath.   Immunization History  Administered Date(s) Administered   [employer] Vaccination 09/27/2019, 11/05/2019       Objective:   Physical Exam BP 122/78 (BP Location: Left Arm, Cuff Size: Normal)   Pulse 68   Temp 98.7 F (37.1 C)   Ht 5\' 3"  (1.6 m)   Wt 180 lb 3.2 oz (81.7 kg)   SpO2 98%   BMI 31.92 kg/m   GENERAL: Obese woman, fully ambulatory, no acute distress. HEAD: Normocephalic, atraumatic.  EYES: Pupils equal, round, reactive to light.  No scleral icterus.  Mild exophthalmos. MOUTH: Dentition intact, oral mucosa moist.  No thrush. NECK: Thick neck.  Cannot make further assessment. PULMONARY: Good air entry bilaterally.  No adventitious sounds. CARDIOVASCULAR: S1 and S2. Regular rate and rhythm.  Grade 1-2/6 systolic ejection murmur left sternal border. ABDOMEN: Benign.   MUSCULOSKELETAL: No joint deformity, no clubbing, no edema.  NEUROLOGIC: No deficit, no gait disturbance.  Speech is fluent. SKIN: Intact,warm,dry. PSYCH: Mood and behavior normal.  Lab Results  Component Value Date   NITRICOXIDE 14 05/14/2022      Assessment & Plan:     ICD-10-CM   1. Dyspnea, unspecified type  R06.00 Nitric oxide   Suspect mostly due to obesity/deconditioning Recommend continue weight loss/conditioning program FeNO normal    2. Encounter to establish care with new doctor  Z76.89 Ambulatory Referral to Primary Care   Referred to [facility]     Orders Placed This Encounter  Procedures   Ambulatory Referral to Primary Care    Referral Priority:   Routine    Referral Type:   Consultation    Referral Reason:   Specialty Services Required    Number of Visits Requested:   1    Nitric oxide   Meds ordered this encounter  Medications   albuterol (VENTOLIN HFA) 108 (90 Base) MCG/ACT inhaler    Sig: Inhale 2 puffs into the lungs every 6 (six) hours as needed for wheezing or shortness of breath.    Dispense:  1 each    Refill:  0   Will see the patient in follow-up on an as needed basis.  She is to contact us should she require further evaluation.   Gailen Shelter, MD Advanced Bronchoscopy PCCM [facility]    *This note was dictated using voice recognition software/Dragon.  Despite best efforts to proofread, errors can occur which can change the meaning. Any transcriptional errors that result from this process are unintentional  and may not be fully corrected at the time of dictation.

## 2022-05-14 NOTE — Patient Instructions (Signed)
You do not have any inflammation in your airway by the test we performed today.  We refilled your albuterol but you should really not need this very much at all.  We will send a referral to the [facility] group so that they can get you with a new provider there.  We will see you back as needed.

## 2022-06-13 DIAGNOSIS — F410 Panic disorder [episodic paroxysmal anxiety] without agoraphobia: Secondary | ICD-10-CM | POA: Diagnosis not present

## 2022-06-13 DIAGNOSIS — F39 Unspecified mood [affective] disorder: Secondary | ICD-10-CM | POA: Diagnosis not present

## 2022-06-13 DIAGNOSIS — F5105 Insomnia due to other mental disorder: Secondary | ICD-10-CM | POA: Diagnosis not present

## 2022-09-11 DIAGNOSIS — F410 Panic disorder [episodic paroxysmal anxiety] without agoraphobia: Secondary | ICD-10-CM | POA: Diagnosis not present

## 2022-09-11 DIAGNOSIS — F5105 Insomnia due to other mental disorder: Secondary | ICD-10-CM | POA: Diagnosis not present

## 2022-09-11 DIAGNOSIS — F39 Unspecified mood [affective] disorder: Secondary | ICD-10-CM | POA: Diagnosis not present

## 2022-09-14 DIAGNOSIS — H0259 Other disorders affecting eyelid function: Secondary | ICD-10-CM | POA: Diagnosis not present

## 2022-09-14 DIAGNOSIS — L718 Other rosacea: Secondary | ICD-10-CM | POA: Diagnosis not present

## 2022-09-14 DIAGNOSIS — H18832 Recurrent erosion of cornea, left eye: Secondary | ICD-10-CM | POA: Diagnosis not present

## 2022-09-14 DIAGNOSIS — M3501 Sicca syndrome with keratoconjunctivitis: Secondary | ICD-10-CM | POA: Diagnosis not present

## 2022-11-14 NOTE — Telephone Encounter (Signed)
Error

## 2022-12-04 DIAGNOSIS — F410 Panic disorder [episodic paroxysmal anxiety] without agoraphobia: Secondary | ICD-10-CM | POA: Diagnosis not present

## 2022-12-04 DIAGNOSIS — F39 Unspecified mood [affective] disorder: Secondary | ICD-10-CM | POA: Diagnosis not present

## 2023-03-06 DIAGNOSIS — F39 Unspecified mood [affective] disorder: Secondary | ICD-10-CM | POA: Diagnosis not present

## 2023-03-06 DIAGNOSIS — F410 Panic disorder [episodic paroxysmal anxiety] without agoraphobia: Secondary | ICD-10-CM | POA: Diagnosis not present

## 2023-04-18 DIAGNOSIS — F410 Panic disorder [episodic paroxysmal anxiety] without agoraphobia: Secondary | ICD-10-CM | POA: Diagnosis not present

## 2023-04-18 DIAGNOSIS — F39 Unspecified mood [affective] disorder: Secondary | ICD-10-CM | POA: Diagnosis not present

## 2023-05-13 DIAGNOSIS — F39 Unspecified mood [affective] disorder: Secondary | ICD-10-CM | POA: Diagnosis not present

## 2023-05-13 DIAGNOSIS — F410 Panic disorder [episodic paroxysmal anxiety] without agoraphobia: Secondary | ICD-10-CM | POA: Diagnosis not present

## 2023-05-14 DIAGNOSIS — M25562 Pain in left knee: Secondary | ICD-10-CM | POA: Diagnosis not present

## 2023-05-14 DIAGNOSIS — M170 Bilateral primary osteoarthritis of knee: Secondary | ICD-10-CM | POA: Diagnosis not present

## 2023-05-14 DIAGNOSIS — M25561 Pain in right knee: Secondary | ICD-10-CM | POA: Diagnosis not present

## 2023-05-17 DIAGNOSIS — D2261 Melanocytic nevi of right upper limb, including shoulder: Secondary | ICD-10-CM | POA: Diagnosis not present

## 2023-05-17 DIAGNOSIS — D2262 Melanocytic nevi of left upper limb, including shoulder: Secondary | ICD-10-CM | POA: Diagnosis not present

## 2023-05-17 DIAGNOSIS — L720 Epidermal cyst: Secondary | ICD-10-CM | POA: Diagnosis not present

## 2023-05-17 DIAGNOSIS — L718 Other rosacea: Secondary | ICD-10-CM | POA: Diagnosis not present

## 2023-07-15 DIAGNOSIS — F39 Unspecified mood [affective] disorder: Secondary | ICD-10-CM | POA: Diagnosis not present

## 2023-07-15 DIAGNOSIS — F410 Panic disorder [episodic paroxysmal anxiety] without agoraphobia: Secondary | ICD-10-CM | POA: Diagnosis not present

## 2023-10-09 DIAGNOSIS — F39 Unspecified mood [affective] disorder: Secondary | ICD-10-CM | POA: Diagnosis not present

## 2023-10-09 DIAGNOSIS — F410 Panic disorder [episodic paroxysmal anxiety] without agoraphobia: Secondary | ICD-10-CM | POA: Diagnosis not present

## 2023-11-05 ENCOUNTER — Encounter: Payer: Self-pay | Admitting: Certified Nurse Midwife

## 2023-11-05 ENCOUNTER — Ambulatory Visit (INDEPENDENT_AMBULATORY_CARE_PROVIDER_SITE_OTHER): Admitting: Certified Nurse Midwife

## 2023-11-05 ENCOUNTER — Other Ambulatory Visit (HOSPITAL_COMMUNITY)
Admission: RE | Admit: 2023-11-05 | Discharge: 2023-11-05 | Disposition: A | Discharge status: Home / Self Care | Source: Ambulatory Visit | Attending: Certified Nurse Midwife | Admitting: Certified Nurse Midwife

## 2023-11-05 VITALS — BP 132/78 | HR 99 | Ht 63.0 in | Wt 180.4 lb

## 2023-11-05 DIAGNOSIS — Z1329 Encounter for screening for other suspected endocrine disorder: Secondary | ICD-10-CM

## 2023-11-05 DIAGNOSIS — Z01419 Encounter for gynecological examination (general) (routine) without abnormal findings: Secondary | ICD-10-CM | POA: Diagnosis not present

## 2023-11-05 DIAGNOSIS — Z124 Encounter for screening for malignant neoplasm of cervix: Secondary | ICD-10-CM

## 2023-11-05 DIAGNOSIS — Z1231 Encounter for screening mammogram for malignant neoplasm of breast: Secondary | ICD-10-CM

## 2023-11-05 DIAGNOSIS — Z131 Encounter for screening for diabetes mellitus: Secondary | ICD-10-CM

## 2023-11-05 DIAGNOSIS — Z13220 Encounter for screening for lipoid disorders: Secondary | ICD-10-CM | POA: Diagnosis not present

## 2023-11-05 DIAGNOSIS — Z1151 Encounter for screening for human papillomavirus (HPV): Secondary | ICD-10-CM

## 2023-11-05 DIAGNOSIS — Z1211 Encounter for screening for malignant neoplasm of colon: Secondary | ICD-10-CM

## 2023-11-05 NOTE — Patient Instructions (Signed)
 Health Maintenance, Female Adopting a healthy lifestyle and getting preventive care are important in promoting health and wellness. Ask your health care provider about: The right schedule for you to have regular tests and exams. Things you can do on your own to prevent diseases and keep yourself healthy. What should I know about diet, weight, and exercise? Eat a healthy diet  Eat a diet that includes plenty of vegetables, fruits, low-fat dairy products, and lean protein. Do not eat a lot of foods that are high in solid fats, added sugars, or sodium. Maintain a healthy weight Body mass index (BMI) is used to identify weight problems. It estimates body fat based on height and weight. Your health care provider can help determine your BMI and help you achieve or maintain a healthy weight. Get regular exercise Get regular exercise. This is one of the most important things you can do for your health. Most adults should: Exercise for at least 150 minutes each week. The exercise should increase your heart rate and make you sweat (moderate-intensity exercise). Do strengthening exercises at least twice a week. This is in addition to the moderate-intensity exercise. Spend less time sitting. Even light physical activity can be beneficial. Watch cholesterol and blood lipids Have your blood tested for lipids and cholesterol at 62 years of age, then have this test every 5 years. Have your cholesterol levels checked more often if: Your lipid or cholesterol levels are high. You are older than 62 years of age. You are at high risk for heart disease. What should I know about cancer screening? Depending on your health history and family history, you may need to have cancer screening at various ages. This may include screening for: Breast cancer. Cervical cancer. Colorectal cancer. Skin cancer. Lung cancer. What should I know about heart disease, diabetes, and high blood pressure? Blood pressure and heart  disease High blood pressure causes heart disease and increases the risk of stroke. This is more likely to develop in people who have high blood pressure readings or are overweight. Have your blood pressure checked: Every 3-5 years if you are 109-28 years of age. Every year if you are 16 years old or older. Diabetes Have regular diabetes screenings. This checks your fasting blood sugar level. Have the screening done: Once every three years after age 62 if you are at a normal weight and have a low risk for diabetes. More often and at a younger age if you are overweight or have a high risk for diabetes. What should I know about preventing infection? Hepatitis B If you have a higher risk for hepatitis B, you should be screened for this virus. Talk with your health care provider to find out if you are at risk for hepatitis B infection. Hepatitis C Testing is recommended for: Everyone born from [birth year] through 1964. Anyone with known risk factors for hepatitis C. Sexually transmitted infections (STIs) Get screened for STIs, including gonorrhea and chlamydia, if: You are sexually active and are younger than 62 years of age. You are older than 62 years of age and your health care provider tells you that you are at risk for this type of infection. Your sexual activity has changed since you were last screened, and you are at increased risk for chlamydia or gonorrhea. Ask your health care provider if you are at risk. Ask your health care provider about whether you are at high risk for HIV. Your health care provider may recommend a prescription medicine to help prevent HIV  infection. If you choose to take medicine to prevent HIV, you should first get tested for HIV. You should then be tested every 3 months for as long as you are taking the medicine. Pregnancy If you are about to stop having your period (premenopausal) and you may become pregnant, seek counseling before you get pregnant. Take 400 to 800  micrograms (mcg) of folic acid every day if you become pregnant. Ask for birth control (contraception) if you want to prevent pregnancy. Osteoporosis and menopause Osteoporosis is a disease in which the bones lose minerals and strength with aging. This can result in bone fractures. If you are 3 years old or older, or if you are at risk for osteoporosis and fractures, ask your health care provider if you should: Be screened for bone loss. Take a calcium or vitamin D supplement to lower your risk of fractures. Be given hormone replacement therapy (HRT) to treat symptoms of menopause. Follow these instructions at home: Alcohol use Do not drink alcohol if: Your health care provider tells you not to drink. You are pregnant, may be pregnant, or are planning to become pregnant. If you drink alcohol: Limit how much you have to: 0-1 drink a day. Know how much alcohol is in your drink. In the [place] one drink equals one 12 oz bottle of beer (355 mL), one 5 oz glass of wine (148 mL), or one 1 oz glass of hard liquor (44 mL). Lifestyle Do not use any products that contain nicotine or tobacco. These products include cigarettes, chewing tobacco, and vaping devices, such as e-cigarettes. If you need help quitting, ask your health care provider. Do not use street drugs. Do not share needles. Ask your health care provider for help if you need support or information about quitting drugs. General instructions Schedule regular health, dental, and eye exams. Stay current with your vaccines. Tell your health care provider if: You often feel depressed. You have ever been abused or do not feel safe at home. Summary Adopting a healthy lifestyle and getting preventive care are important in promoting health and wellness. Follow your health care provider's instructions about healthy diet, exercising, and getting tested or screened for diseases. Follow your health care provider's instructions on monitoring your  cholesterol and blood pressure. This information is not intended to replace advice given to you by your health care provider. Make sure you discuss any questions you have with your health care provider. Document Revised: 11/21/2020 Document Reviewed: 11/21/2020 Elsevier Patient Education  2024 [employer] Colonoscopy, Adult A colonoscopy is a procedure to look at the entire large intestine. This procedure is done using a long, thin, flexible tube that has a camera on the end. You may have a colonoscopy: As a part of normal colorectal screening. If you have certain symptoms, such as: A low number of red blood cells in your blood (anemia). Diarrhea that does not go away. Pain in your abdomen. Blood in your stool. A colonoscopy can help screen for and diagnose medical problems, including: An abnormal growth of cells or tissue (tumor). Abnormal growths within the lining of your intestine (polyps). Inflammation. Areas of bleeding. Tell your health care provider about: Any allergies you have. All medicines you are taking, including vitamins, herbs, eye drops, creams, and over-the-counter medicines. Any problems you or family members have had with anesthetic medicines. Any bleeding problems you have. Any surgeries you have had. Any medical conditions you have. Any problems you have had with having bowel movements. Whether you are  pregnant or may be pregnant. What are the risks? Generally, this is a safe procedure. However, problems may occur, including: Bleeding. Damage to your intestine. Allergic reactions to medicines given during the procedure. Infection. This is rare. What happens before the procedure? Eating and drinking restrictions Follow instructions from your health care provider about eating or drinking restrictions, which may include: A few days before the procedure: Follow a low-fiber diet. Avoid nuts, seeds, dried fruit, raw fruits, and vegetables. 1-3 days before the  procedure: Eat only gelatin dessert or ice pops. Drink only clear liquids, such as water, clear juice, clear broth or bouillon, black coffee or tea, or clear soft drinks or sports drinks. Avoid liquids that contain red or purple dye. The day of the procedure: Do not eat solid foods. You may continue to drink clear liquids until up to 2 hours before the procedure. Do not eat or drink anything starting 2 hours before the procedure, or within the time period that your health care provider recommends. Bowel prep If you were prescribed a bowel prep to take by mouth (orally) to clean out your colon: Take it as told by your health care provider. Starting the day before your procedure, you will need to drink a large amount of liquid medicine. The liquid will cause you to have many bowel movements of loose stool until your stool becomes almost clear or light green. If your skin or the opening between the buttocks (anus) gets irritated from diarrhea, you may relieve the irritation using: Wipes with medicine in them, such as adult wet wipes with aloe and vitamin E. A product to soothe skin, such as petroleum jelly. If you vomit while drinking the bowel prep: Take a break for up to 60 minutes. Begin the bowel prep again. Call your health care provider if you keep vomiting or you cannot take the bowel prep without vomiting. To clean out your colon, you may also be given: Laxative medicines. These help you have a bowel movement. Instructions for enema use. An enema is liquid medicine injected into your rectum. Medicines Ask your health care provider about: Changing or stopping your regular medicines or supplements. This is especially important if you are taking iron supplements, diabetes medicines, or blood thinners. Taking medicines such as aspirin and ibuprofen. These medicines can thin your blood. Do not take these medicines unless your health care provider tells you to take them. Taking  over-the-counter medicines, vitamins, herbs, and supplements. General instructions Ask your health care provider what steps will be taken to help prevent infection. These may include washing skin with a germ-killing soap. If you will be going home right after the procedure, plan to have a responsible adult: Take you home from the hospital or clinic. You will not be allowed to drive. Care for you for the time you are told. What happens during the procedure?  An IV will be inserted into one of your veins. You will be given a medicine to make you fall asleep (general anesthetic). You will lie on your side with your knees bent. A lubricant will be put on the tube. Then the tube will be: Inserted into your anus. Gently eased through all parts of your large intestine. Air will be sent into your colon to keep it open. This may cause some pressure or cramping. Images will be taken with the camera and will appear on a screen. A small tissue sample may be removed to be looked at under a microscope (biopsy). The tissue may  be sent to a lab for testing if any signs of problems are found. If small polyps are found, they may be removed and checked for cancer cells. When the procedure is finished, the tube will be removed. The procedure may vary among health care providers and hospitals. What happens after the procedure? Your blood pressure, heart rate, breathing rate, and blood oxygen level will be monitored until you leave the hospital or clinic. You may have a small amount of blood in your stool. You may pass gas and have mild cramping or bloating in your abdomen. This is caused by the air that was used to open your colon during the exam. If you were given a sedative during the procedure, it can affect you for several hours. Do not drive or operate machinery until your health care provider says that it is safe. It is up to you to get the results of your procedure. Ask your health care provider, or the  department that is doing the procedure, when your results will be ready. Summary A colonoscopy is a procedure to look at the entire large intestine. Follow instructions from your health care provider about eating and drinking before the procedure. If you were prescribed an oral bowel prep to clean out your colon, take it as told by your health care provider. During the colonoscopy, a flexible tube with a camera on its end is inserted into the anus and then passed into all parts of the large intestine. This information is not intended to replace advice given to you by your health care provider. Make sure you discuss any questions you have with your health care provider. Document Revised: 08/14/2022 Document Reviewed: 02/22/2021 Elsevier Patient Education  2024 [employer].

## 2023-11-05 NOTE — Progress Notes (Signed)
 ANNUAL EXAM Patient name: Krystal Smith MRN [id number]  Date of birth: Jan 13, 1961 Chief Complaint:   Annual Exam  History of Present Illness:   Krystal Smith is a 63 y.o. G0P0 Caucasian female being seen today for a routine annual exam.  Current complaints: none, desires pap today. She does not have a PCP and desires screening bloodwork. Has not had a colonoscopy, has done cologard in past with negative result. Due for mammogram. Mood- hx of anxiety/depression, managed with medication and is under care of psychiatrist.  No LMP recorded. Patient is postmenopausal.      Component Value Date/Time   DIAGPAP  05/26/2019 1328    - Negative for intraepithelial lesion or malignancy (NILM)   DIAGPAP  02/05/2018 0000    NEGATIVE FOR INTRAEPITHELIAL LESIONS OR MALIGNANCY.   DIAGPAP  02/05/2018 0000    FUNGAL ORGANISMS PRESENT CONSISTENT WITH CANDIDA SPP.   HPVHIGH Negative 05/26/2019 1328   ADEQPAP  05/26/2019 1328    Satisfactory for evaluation; transformation zone component PRESENT.   ADEQPAP  02/05/2018 0000    Satisfactory for evaluation  endocervical/transformation zone component PRESENT.       Last pap 2023. Results were: NILM w/ HRHPV negative. H/O abnormal pap: no Last mammogram: 2023. Results were: BIRADS 3. Family h/o breast cancer:  Yes Last colonoscopy: n/a-has done Cologard. Results were: N/A. Family h/o colorectal cancer: no     03/14/2018    3:47 PM  Depression screen PHQ 2/9  Decreased Interest 0  Down, Depressed, Hopeless 0  PHQ - 2 Score 0   Family History  Problem Relation Age of Onset   Breast cancer Mother 69   Hypertension Mother    Thyroid  disease Mother    Osteoporosis Mother    Cancer Father        lymphoma; Squamous cell of face   Dementia Father    Heart disease Father        MI AND CABG   Breast cancer Maternal Aunt 50       Bilateral Cancer/Mastectomy   Cancer Other        cancerous tumor on kidney   Pancreatic cancer  Other 58   Past Medical History:  Diagnosis Date   Allergic rhinitis    Anxiety    Asthma    Bacterial pneumonia 12/2008   HOSP X2D   Bipolar disorder (HCC)    BRCA negative 2013; 8/19   2013 BRCA neg; 2019 MyRisk neg except MSH3 VUS   Depression    Family history of breast cancer    mother age 44, mat aunt-two primary br cas in either breast-first cancer age 53, mgac breast and pancreatic   Fibroadenoma of breast    IBS (irritable bowel syndrome)    Increased risk of breast cancer 02/2018   IBIS=24.7%/riskscore=37.6%   Infertility    Migraine    Rosacea    Vitamin D deficiency           No data to display           Review of Systems:   Pertinent items are noted in HPI Denies any headaches, blurred vision, fatigue, shortness of breath, chest pain, abdominal pain, abnormal vaginal discharge/itching/odor/irritation, problems with periods, bowel movements, urination, or intercourse unless otherwise stated above. Pertinent History Reviewed:  Reviewed past medical,surgical, social and family history.  Reviewed problem list, medications and allergies. Physical Assessment:   Vitals:   11/05/23 1342 11/05/23 1424  BP: (!) 143/83 132/78  Pulse: 96  99  Weight: 180 lb 6.4 oz (81.8 kg)   Height: 5\' 3"  (1.6 m)   Body mass index is 31.96 kg/m.       Physical Exam Vitals reviewed.  Constitutional:      General: She is not in acute distress.    Appearance: Normal appearance. She is obese.  HENT:     Head: Normocephalic.  Neck:     Thyroid : No thyroid  mass or thyromegaly.  Cardiovascular:     Rate and Rhythm: Normal rate and regular rhythm.     Heart sounds: Normal heart sounds.  Pulmonary:     Effort: Pulmonary effort is normal.     Breath sounds: Normal breath sounds.  Chest:  Breasts:    Tanner Score is 5.     Right: Tenderness present. No inverted nipple.     Left: Tenderness present. No inverted nipple.  Abdominal:     Palpations: Abdomen is soft.      Tenderness: There is no abdominal tenderness.  Genitourinary:    General: Normal vulva.     Tanner stage (genital): 5.     Vagina: Normal.     Uterus: Not enlarged, not fixed and not tender.      Adnexa:        Right: No mass or tenderness.         Left: No mass or tenderness.       Comments: Atrophic changes consistent with menopause noted. Cervical os appears stenotic. Pap collected. Musculoskeletal:     Cervical back: Neck supple. No tenderness.  Lymphadenopathy:     Upper Body:     Right upper body: No axillary adenopathy.     Left upper body: No axillary adenopathy.  Skin:    General: Skin is warm and dry.  Neurological:     General: No focal deficit present.     Mental Status: She is alert and oriented to person, place, and time.  Psychiatric:        Mood and Affect: Mood normal.        Behavior: Behavior normal.      Results for orders placed or performed in visit on 11/05/23 (from the past 24 hours)  Basic metabolic panel with GFR   Collection Time: 11/05/23  2:31 PM  Result Value Ref Range   Glucose 124 (H) 70 - 99 mg/dL   BUN 14 8 - 27 mg/dL   Creatinine, Ser 1.61 0.57 - 1.00 mg/dL   eGFR 85 >09 UE/AVW/0.98   BUN/Creatinine Ratio 18 12 - 28   Sodium 141 134 - 144 mmol/L   Potassium 4.6 3.5 - 5.2 mmol/L   Chloride 100 96 - 106 mmol/L   CO2 30 (H) 20 - 29 mmol/L   Calcium 9.4 8.7 - 10.3 mg/dL  CBC   Collection Time: 11/05/23  2:31 PM  Result Value Ref Range   WBC 7.7 3.4 - 10.8 x10E3/uL   RBC 5.12 3.77 - 5.28 x10E6/uL   Hemoglobin 15.1 11.1 - 15.9 g/dL   Hematocrit 11.9 14.7 - 46.6 %   MCV 90 79 - 97 fL   MCH 29.5 26.6 - 33.0 pg   MCHC 32.8 31.5 - 35.7 g/dL   RDW 82.9 56.2 - 13.0 %   Platelets 264 150 - 450 x10E3/uL  Lipid panel   Collection Time: 11/05/23  2:31 PM  Result Value Ref Range   Cholesterol, Total 229 (H) 100 - 199 mg/dL   Triglycerides 865 (H) 0 - 149 mg/dL  HDL 68 >39 mg/dL   VLDL Cholesterol Cal 39 5 - 40 mg/dL   LDL Chol Calc (NIH)  122 (H) 0 - 99 mg/dL   Chol/HDL Ratio 3.4 0.0 - 4.4 ratio  TSH   Collection Time: 11/05/23  2:31 PM  Result Value Ref Range   TSH 3.170 0.450 - 4.500 uIU/mL  Hemoglobin A1c   Collection Time: 11/05/23  2:31 PM  Result Value Ref Range   Hgb A1c MFr Bld 6.4 (H) 4.8 - 5.6 %   Est. average glucose Bld gHb Est-mCnc 137 mg/dL    Assessment & Plan:  1. Well woman exam (Primary) - Basic metabolic panel with GFR - CBC - Lipid panel - TSH - Hemoglobin A1c  2. Screening for diabetes mellitus - Hemoglobin A1c  3. Screening for thyroid  disorder - TSH  4. Lipid screening - Lipid panel  5. Cervical cancer screening - Cytology - PAP  6. Encounter for screening for human papillomavirus (HPV) - Cytology - PAP  7. Breast cancer screening by mammogram - MM 3D SCREENING MAMMOGRAM BILATERAL BREAST; Future  8. Colon cancer screening - Ambulatory referral to Gastroenterology   Mammogram: schedule screening mammo as soon as possible, or sooner if problems Colonoscopy:  GI referral placed  Orders Placed This Encounter  Procedures   MM 3D SCREENING MAMMOGRAM BILATERAL BREAST   Basic metabolic panel with GFR   CBC   Lipid panel   TSH   Hemoglobin A1c   Ambulatory referral to Gastroenterology    Meds: No orders of the defined types were placed in this encounter.   Follow-up: Return in 1 year (on 11/04/2024) for Annual exam.  Forestine Igo, CNM 11/05/2023 2:10 PM

## 2023-11-06 LAB — BASIC METABOLIC PANEL WITH GFR
BUN/Creatinine Ratio: 18 (ref 12–28)
BUN: 14 mg/dL (ref 8–27)
CO2: 30 mmol/L — ABNORMAL HIGH (ref 20–29)
Calcium: 9.4 mg/dL (ref 8.7–10.3)
Chloride: 100 mmol/L (ref 96–106)
Creatinine, Ser: 0.79 mg/dL (ref 0.57–1.00)
Glucose: 124 mg/dL — ABNORMAL HIGH (ref 70–99)
Potassium: 4.6 mmol/L (ref 3.5–5.2)
Sodium: 141 mmol/L (ref 134–144)
eGFR: 85 mL/min/{1.73_m2} (ref >59)

## 2023-11-06 LAB — CBC
Hematocrit: 46 % (ref 34.0–46.6)
Hemoglobin: 15.1 g/dL (ref 11.1–15.9)
MCH: 29.5 pg (ref 26.6–33.0)
MCHC: 32.8 g/dL (ref 31.5–35.7)
MCV: 90 fL (ref 79–97)
Platelets: 264 10*3/uL (ref 150–450)
RBC: 5.12 x10E6/uL (ref 3.77–5.28)
RDW: 12.3 % (ref 11.7–15.4)
WBC: 7.7 10*3/uL (ref 3.4–10.8)

## 2023-11-06 LAB — LIPID PANEL
Chol/HDL Ratio: 3.4 ratio (ref 0.0–4.4)
Cholesterol, Total: 229 mg/dL — ABNORMAL HIGH (ref 100–199)
HDL: 68 mg/dL (ref >39)
LDL Chol Calc (NIH): 122 mg/dL — ABNORMAL HIGH (ref 0–99)
Triglycerides: 228 mg/dL — ABNORMAL HIGH (ref 0–149)
VLDL Cholesterol Cal: 39 mg/dL (ref 5–40)

## 2023-11-06 LAB — TSH: TSH: 3.17 u[IU]/mL (ref 0.450–4.500)

## 2023-11-06 LAB — HEMOGLOBIN A1C
Est. average glucose Bld gHb Est-mCnc: 137 mg/dL
Hgb A1c MFr Bld: 6.4 % — ABNORMAL HIGH (ref 4.8–5.6)

## 2023-11-07 ENCOUNTER — Encounter: Payer: Self-pay | Admitting: Certified Nurse Midwife

## 2023-11-07 LAB — CYTOLOGY - PAP
Comment: NEGATIVE
Diagnosis: NEGATIVE
High risk HPV: NEGATIVE

## 2023-11-08 ENCOUNTER — Telehealth: Payer: Self-pay

## 2023-11-08 DIAGNOSIS — Z1211 Encounter for screening for malignant neoplasm of colon: Secondary | ICD-10-CM

## 2023-11-08 NOTE — Telephone Encounter (Signed)
 Gastroenterology Pre-Procedure Review  Request Date: TBD Requesting Physician: Dr. Rebbeca Campi  PATIENT REVIEW QUESTIONS: The patient responded to the following health history questions as indicated:    1. Are you having any GI issues? no GI Issues.  She has had to have her esophagus stretched  but has not been done in many years.  No problems at this time. 2. Do you have a personal history of Polyps? no 3. Do you have a family history of Colon Cancer or Polyps? no 4. Diabetes Mellitus? no 5. Joint replacements in the past 12 months? Pre-diabetics 6. Major health problems in the past 3 months?no 7. Any artificial heart valves, MVP, or defibrillator?no    MEDICATIONS & ALLERGIES:    Patient reports the following regarding taking any anticoagulation/antiplatelet therapy:   Plavix, Coumadin, Eliquis, Xarelto, Lovenox, Pradaxa, Brilinta, or Effient? no Aspirin? no  Patient confirms/reports the following medications:  Current Outpatient Medications  Medication Sig Dispense Refill   albuterol  (VENTOLIN  HFA) 108 (90 Base) MCG/ACT inhaler Inhale 2 puffs into the lungs every 6 (six) hours as needed for wheezing or shortness of breath. 1 each 0   aspirin-acetaminophen -caffeine  (EXCEDRIN MIGRAINE) 250-250-65 MG tablet Take 1 tablet by mouth every 6 (six) hours as needed for headache.     ibuprofen (ADVIL) 200 MG tablet Take 200 mg by mouth as needed.     Ivermectin (SOOLANTRA) 1 % CREA Apply topically daily.     lamoTRIgine (LAMICTAL) 200 MG tablet Take 200 mg by mouth daily.      Melatonin 3 MG TABS Take 12 mg by mouth as needed.     ORACEA 40 MG capsule Take 40 mg by mouth as needed.     sertraline (ZOLOFT) 100 MG tablet Take 200 mg by mouth every morning.     traZODone (DESYREL) 100 MG tablet Take 1 tablet by mouth as needed.     valACYclovir (VALTREX) 1000 MG tablet Take 1 tablet by mouth as needed. (Patient not taking: Reported on 11/05/2023)     No current facility-administered medications for  this visit.    Patient confirms/reports the following allergies:  Allergies  Allergen Reactions   Amitriptyline Other (See Comments)    Nervous ending pulse   Amoxil [Amoxicillin]    Corn-Containing Products     And other foods - diarrhea   Depakote [Divalproex Sodium] Other (See Comments)    Uncontrolled muscle movement   Erythromycin    Ultracet [Tramadol-Acetaminophen ] Itching   Imitrex [Sumatriptan] Palpitations    Worse migraine   Skelaxin [Metaxalone] Palpitations    migraine   Tetracyclines & Related Rash   Venlafaxine Other (See Comments)    Night sweats.    No orders of the defined types were placed in this encounter.   AUTHORIZATION INFORMATION Primary Insurance: 1D#: Group #:  Secondary Insurance: 1D#: Group #:  SCHEDULE INFORMATION: Date: TBD Time: Location: TBD

## 2023-11-19 ENCOUNTER — Other Ambulatory Visit: Payer: Self-pay

## 2023-11-19 DIAGNOSIS — Z1231 Encounter for screening mammogram for malignant neoplasm of breast: Secondary | ICD-10-CM

## 2023-11-21 ENCOUNTER — Telehealth: Payer: Self-pay

## 2023-11-21 ENCOUNTER — Telehealth: Payer: Self-pay | Admitting: *Deleted

## 2023-11-21 ENCOUNTER — Other Ambulatory Visit: Payer: Self-pay | Admitting: *Deleted

## 2023-11-21 DIAGNOSIS — Z1211 Encounter for screening for malignant neoplasm of colon: Secondary | ICD-10-CM

## 2023-11-21 MED ORDER — NA SULFATE-K SULFATE-MG SULF 17.5-3.13-1.6 GM/177ML PO SOLN: 1.0000 | Freq: Once | ORAL | 0 refills | Status: AC

## 2023-11-21 NOTE — Telephone Encounter (Signed)
 Gastroenterology Pre-Procedure Review  Request Date: 12/31/2023 Requesting Physician: Dr. Ole Berkeley  PATIENT REVIEW QUESTIONS: The patient responded to the following health history questions as indicated:    1. Are you having any GI issues? no 2. Do you have a personal history of Polyps? no 3. Do you have a family history of Colon Cancer or Polyps? no 4. Diabetes Mellitus? no 5. Joint replacements in the past 12 months?no 6. Major health problems in the past 3 months?no 7. Any artificial heart valves, MVP, or defibrillator?no    MEDICATIONS & ALLERGIES:    Patient reports the following regarding taking any anticoagulation/antiplatelet therapy:   Plavix, Coumadin, Eliquis, Xarelto, Lovenox, Pradaxa, Brilinta, or Effient? no Aspirin? no  Patient confirms/reports the following medications:  Current Outpatient Medications  Medication Sig Dispense Refill   albuterol  (VENTOLIN  HFA) 108 (90 Base) MCG/ACT inhaler Inhale 2 puffs into the lungs every 6 (six) hours as needed for wheezing or shortness of breath. 1 each 0   aspirin-acetaminophen -caffeine  (EXCEDRIN MIGRAINE) 250-250-65 MG tablet Take 1 tablet by mouth every 6 (six) hours as needed for headache.     ibuprofen (ADVIL) 200 MG tablet Take 200 mg by mouth as needed.     Ivermectin (SOOLANTRA) 1 % CREA Apply topically daily.     lamoTRIgine (LAMICTAL) 200 MG tablet Take 200 mg by mouth daily.      Melatonin 3 MG TABS Take 12 mg by mouth as needed.     ORACEA 40 MG capsule Take 40 mg by mouth as needed.     sertraline (ZOLOFT) 100 MG tablet Take 200 mg by mouth every morning.     traZODone (DESYREL) 100 MG tablet Take 1 tablet by mouth as needed.     valACYclovir (VALTREX) 1000 MG tablet Take 1 tablet by mouth as needed. (Patient not taking: Reported on 11/05/2023)     No current facility-administered medications for this visit.    Patient confirms/reports the following allergies:  Allergies  Allergen Reactions   Amitriptyline Other (See  Comments)    Nervous ending pulse   Amoxil [Amoxicillin]    Corn-Containing Products     And other foods - diarrhea   Depakote [Divalproex Sodium] Other (See Comments)    Uncontrolled muscle movement   Erythromycin    Ultracet [Tramadol-Acetaminophen ] Itching   Imitrex [Sumatriptan] Palpitations    Worse migraine   Skelaxin [Metaxalone] Palpitations    migraine   Tetracyclines & Related Rash   Venlafaxine Other (See Comments)    Night sweats.    No orders of the defined types were placed in this encounter.   AUTHORIZATION INFORMATION Primary Insurance: 1D#: Group #:  Secondary Insurance: 1D#: Group #:  SCHEDULE INFORMATION: Date: 12/31/2023 Time: Location:  [facility]

## 2023-11-21 NOTE — Telephone Encounter (Signed)
 Jesiah called triage wanting to know where her breast imaging order went I looked at the order and states [facility]. She asked if Camilo Cella could call her I did advise her she was out of the office today.

## 2023-11-25 ENCOUNTER — Encounter: Payer: Self-pay | Admitting: Certified Nurse Midwife

## 2023-12-12 ENCOUNTER — Encounter: Payer: Self-pay | Admitting: Pulmonary Disease

## 2023-12-12 ENCOUNTER — Ambulatory Visit: Admitting: Pulmonary Disease

## 2023-12-12 ENCOUNTER — Ambulatory Visit
Admission: RE | Admit: 2023-12-12 | Discharge: 2023-12-12 | Disposition: A | Discharge status: Home / Self Care | Source: Ambulatory Visit | Attending: Pulmonary Disease | Admitting: Pulmonary Disease

## 2023-12-12 VITALS — BP 134/82 | HR 78 | Ht 63.0 in | Wt 180.0 lb

## 2023-12-12 DIAGNOSIS — Z87891 Personal history of nicotine dependence: Secondary | ICD-10-CM

## 2023-12-12 DIAGNOSIS — R058 Other specified cough: Secondary | ICD-10-CM | POA: Diagnosis not present

## 2023-12-12 DIAGNOSIS — R0600 Dyspnea, unspecified: Secondary | ICD-10-CM

## 2023-12-12 DIAGNOSIS — G4733 Obstructive sleep apnea (adult) (pediatric): Secondary | ICD-10-CM | POA: Diagnosis not present

## 2023-12-12 DIAGNOSIS — J301 Allergic rhinitis due to pollen: Secondary | ICD-10-CM

## 2023-12-12 DIAGNOSIS — R0602 Shortness of breath: Secondary | ICD-10-CM | POA: Diagnosis not present

## 2023-12-12 LAB — NITRIC OXIDE: Nitric Oxide: 10

## 2023-12-12 MED ORDER — ALBUTEROL SULFATE HFA 108 (90 BASE) MCG/ACT IN AERS: 2.0000 | INHALATION_SPRAY | Freq: Four times a day (QID) | RESPIRATORY_TRACT | 0 refills | Status: DC | PRN

## 2023-12-12 NOTE — Progress Notes (Signed)
 Subjective:    Patient ID: Krystal Smith, female    DOB: January 11, 1961, 63 y.o.   MRN: [id number]  Patient Care Team: Jolanda Nation, NP as PCP - General (General Practice) Alben Alma, MD as Referring Physician (Obstetrics and Gynecology) Marquita Situ Magali Schmitz, MD (General Surgery)  Chief Complaint  Patient presents with   Follow-up    She is asking for updated rx for albuterol . She rarely uses inhaler. She has had nocturnal cough- non prod over the past 6 months, "tickle in throat".  She gets SOB if she walks up stairs. Has o2 concentrator at home she bought from [employer] but is not using.     BACKGROUND: 63 year old remote former smoker with a history as noted below who follows for the issue of dyspnea.  She had not been seen here since 14 May 2022.   HPI Discussed the use of AI scribe software for clinical note transcription with the patient, who gave verbal consent to proceed.  History of Present Illness   Krystal Smith is a 63 year old female who presents with shortness of breath.  She experiences shortness of breath, particularly when running up stairs or moving quickly. She describes herself as 'heavy' and attributes her symptoms to a lack of regular exercise. There has been no significant increase in shortness of breath since her last visit.  She has a history of smoking, having quit 33 years ago, and smoked no more than four cigarettes a week during that time. 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  She experiences a dry cough at night, typically between 12 and 3 AM, which she attributes to a 'tickle spot' in her throat. No associated drainage is noted.  She reports snoring 'like a freight train' and has been advised to use a CPAP machine for suspected sleep apnea, but she experiences migraines when using it. She owns an oxygen concentrator purchased from [place] but is unsure how to use it properly.  This was  obtained on her own without a prescription.     DATA: 07/26/2015 PFTs: Performed at [facility] clinic, FEV1 1.85 L or 80% predicted, FVC 2.19 L or 79% predicted.  FEV1/FVC 84%.  Diffusion capacity normal.  Flow volume loop normal.  Essentially normal PFTs. 05/20/2019 one 2D echo: LVEF 60 to 65%, no diastolic dysfunction. 08/02/2020 PFTs: FEV1 1.79 L or 71% predicted, FVC 1.97 L or 60% predicted, FEV1/FVC 91%, ERV 30%, diminished.  Flow volume loop normal.  Diffusion capacity normal.  Consistent with pseudo restriction due to obesity.  Review of Systems A 10 point review of systems was performed and it is as noted above otherwise negative.   Past Medical History:  Diagnosis Date   Allergic rhinitis    Anxiety    Asthma    Bacterial pneumonia 12/2008   HOSP X2D   Bipolar disorder (HCC)    BRCA negative 2013; 8/19   2013 BRCA neg; 2019 MyRisk neg except MSH3 VUS   Depression    Family history of breast cancer    mother age 62, mat aunt-two primary br cas in either breast-first cancer age 80, mgac breast and pancreatic   Fibroadenoma of breast    IBS (irritable bowel syndrome)    Increased risk of breast cancer 02/2018   IBIS=24.7%/riskscore=37.6%   Infertility    Migraine    Rosacea    Vitamin D deficiency     Past Surgical History:  Procedure Laterality Date  BREAST BIOPSY Right 2000   fibroadenoma, core biopsy.   BREAST BIOPSY Left 06/21/2014   stereotatic, fibrocystic changes with microcalcifications. BRCA testing neagative   BREAST CYST ASPIRATION Left 04/2006   BREAST CYST ASPIRATION Right 2007   CHOLECYSTECTOMY  10/2019   NASAL SINUS SURGERY  2004   PECTUS EXCAVATUM REPAIR     age 40 at [facility]   Repair left ankle fracture     TONSILLECTOMY     WISDOM TOOTH EXTRACTION      Patient Active Problem List   Diagnosis Date Noted   Mixed hyperlipidemia 12/23/2023   Establishing care with new doctor, encounter for 12/23/2023   Increased risk of breast cancer 04/20/2018    Prediabetes 03/15/2018   Rosacea    Vitamin D deficiency    Migraine    IBS (irritable bowel syndrome)    Family history of breast cancer    Depression    Bipolar disorder (HCC)    Anxiety    Allergic rhinitis    Hematoma complicating a procedure 06/22/2014   Breast microcalcification, mammographic 11/26/2013    Family History  Problem Relation Age of Onset   Breast cancer Mother 73   Hypertension Mother    Thyroid  disease Mother    Osteoporosis Mother    Cancer Father        lymphoma; Squamous cell of face   Dementia Father    Heart disease Father        MI AND CABG   Heart attack Brother    Breast cancer Maternal Aunt 50       Bilateral Cancer/Mastectomy   Cancer Other        cancerous tumor on kidney   Pancreatic cancer Other 40    Social History   Tobacco Use   Smoking status: Former    Current packs/day: 0.00    Types: Cigarettes    Quit date: 1984    Years since quitting: 41.4   Smokeless tobacco: Never   Tobacco comments:    Quit in 1994- 05/14/2022- khj  patient states she has not smoked in 30 years  Substance Use Topics   Alcohol use: Yes    Alcohol/week: 0.0 standard drinks of alcohol    Comment: 0-1 BEER    Allergies  Allergen Reactions   Amitriptyline Other (See Comments)    Nervous ending pulse   Amoxil [Amoxicillin]    Corn-Containing Products     And other foods - diarrhea   Depakote [Divalproex Sodium] Other (See Comments)    Uncontrolled muscle movement   Erythromycin    Ultracet Finlee.Found ] Itching   Imitrex [Sumatriptan] Palpitations    Worse migraine   Skelaxin [Metaxalone] Palpitations    migraine   Tetracyclines & Related Rash   Venlafaxine Other (See Comments)    Night sweats.    Current Meds  Medication Sig   aspirin-acetaminophen -caffeine  (EXCEDRIN MIGRAINE) 250-250-65 MG tablet Take 1 tablet by mouth every 6 (six) hours as needed for headache.   ibuprofen (ADVIL) 200 MG tablet Take 200 mg by mouth as  needed.   lamoTRIgine (LAMICTAL) 200 MG tablet Take 200 mg by mouth daily.    Melatonin 3 MG TABS Take 12 mg by mouth as needed.   sertraline (ZOLOFT) 100 MG tablet Take 200 mg by mouth every morning.   traZODone (DESYREL) 100 MG tablet Take 1 tablet by mouth as needed.   valACYclovir (VALTREX) 1000 MG tablet Take 1 tablet by mouth as needed.   [DISCONTINUED] albuterol  (VENTOLIN  HFA)  108 (90 Base) MCG/ACT inhaler Inhale 2 puffs into the lungs every 6 (six) hours as needed for wheezing or shortness of breath.   [DISCONTINUED] Ivermectin (SOOLANTRA) 1 % CREA Apply topically daily.   [DISCONTINUED] ORACEA 40 MG capsule Take 40 mg by mouth as needed.    Immunization History  Administered Date(s) Administered   Moderna Covid-19 Vaccine Bivalent Booster 58yrs & up 02/16/2022   Moderna Sars-Covid-2 Vaccination 09/27/2019, 11/05/2019, 08/09/2020   Tdap 12/23/2023        Objective:     BP 134/82 (BP Location: Right Arm, Cuff Size: Normal)   Pulse 78   Ht 5\' 3"  (1.6 m)   Wt 180 lb (81.6 kg)   SpO2 92%   BMI 31.89 kg/m   SpO2: 92 % O2 Device: None (Room air)  GENERAL: Obese woman, fully ambulatory, no acute distress. HEAD: Normocephalic, atraumatic.  EYES: Pupils equal, round, reactive to light.  No scleral icterus.  Mild exophthalmos. MOUTH: Dentition intact, oral mucosa moist.  No thrush. NECK: Thick neck.  Cannot make further assessment. PULMONARY: Good air entry bilaterally.  No adventitious sounds. CARDIOVASCULAR: S1 and S2. Regular rate and rhythm.  Grade 1-2/6 systolic ejection murmur left sternal border. ABDOMEN: Benign.   MUSCULOSKELETAL: No joint deformity, no clubbing, no edema.  NEUROLOGIC: No deficit, no gait disturbance.  Speech is fluent. SKIN: Intact,warm,dry. PSYCH: Mood and behavior normal.:  Lab Results  Component Value Date   NITRICOXIDE 10 12/12/2023  *No evidence of type II inflammation       Assessment & Plan:     ICD-10-CM   1. Dyspnea,  unspecified type  R06.00 DG Chest 2 View    Nitric oxide     Pulse oximetry, overnight    Pulmonary function test      Orders Placed This Encounter  Procedures   DG Chest 2 View    Standing Status:   Future    Number of Occurrences:   1    Expiration Date:   12/11/2024    Reason for Exam (SYMPTOM  OR DIAGNOSIS REQUIRED):   Shortness of breath    Preferred imaging location?:   [facility]   Nitric oxide    Pulse oximetry, overnight    Standing Status:   Future    Expiration Date:   12/11/2024    Scheduling Instructions:     On RA   Pulmonary function test    Standing Status:   Future    Expected Date:   12/26/2023    Expiration Date:   12/11/2024    Where should this test be performed?:   Outpatient Pulmonary    What type of PFT is being ordered?:   Full PFT    Meds ordered this encounter  Medications   albuterol  (VENTOLIN  HFA) 108 (90 Base) MCG/ACT inhaler    Sig: Inhale 2 puffs into the lungs every 6 (six) hours as needed for wheezing or shortness of breath.    Dispense:  1 each    Refill:  0   Discussion:    Shortness of breath Intermittent dyspnea, particularly with exertion such as stair climbing. Oxygen saturation remains at 97% during ambulation. No airway inflammation detected. Dry nocturnal cough possibly related to post-nasal drip or other causes. Further evaluation required for nocturnal cough and dyspnea etiology. - Order chest x-ray to evaluate lung structure and rule out underlying pathology. - Assess nocturnal oxygen saturation without supplemental oxygen for hypoxemia. - Repeat pulmonary function test to monitor lung function.  Obstructive sleep apnea  Obstructive sleep apnea with CPAP intolerance due to migraines. Reports snoring but no CPAP use due to discomfort. Oxygen concentrator available but not utilized due to lack of knowledge on settings. Reports airflow from concentrator but unsure of appropriate settings.  Prior oximetry has shown nocturnal  hypoxemia. - Evaluate nocturnal oxygen saturation to determine need for further intervention.  History of tobacco use Remote tobacco use, ceased 33 years ago with minimal smoking history (approximately four cigarettes per week).     Advised if symptoms do not improve or worsen, to please contact office for sooner follow up or seek emergency care.    I spent 40 minutes of dedicated to the care of this patient on the date of this encounter to include pre-visit review of records, face-to-face time with the patient discussing conditions above, post visit ordering of testing, clinical documentation with the electronic health record, making appropriate referrals as documented, and communicating necessary findings to members of the patients care team.   C. Chloe Counter, MD Advanced Bronchoscopy PCCM [facility]    *This note was dictated using voice recognition software/Dragon.  Despite best efforts to proofread, errors can occur which can change the meaning. Any transcriptional errors that result from this process are unintentional and may not be fully corrected at the time of dictation.

## 2023-12-12 NOTE — Patient Instructions (Signed)
 VISIT SUMMARY:  Today, you were seen for shortness of breath, particularly with exertion, and issues related to obstructive sleep apnea. We discussed your symptoms, including your dry nocturnal cough and your history of smoking. We also reviewed your current use of a CPAP machine and oxygen concentrator.  YOUR PLAN:  -SHORTNESS OF BREATH: Shortness of breath, especially when exerting yourself, can be due to various reasons. We will conduct a chest x-ray to check your lungs and rule out any underlying issues. We will also assess your oxygen levels at night without supplemental oxygen and repeat a pulmonary function test to monitor your lung function.  -OBSTRUCTIVE SLEEP APNEA: Obstructive sleep apnea is a condition where your breathing stops and starts during sleep. You have had trouble using your CPAP machine due to migraines. We will educate you on how to properly use your oxygen concentrator, suggesting a setting of 2 L/min, and evaluate your oxygen levels at night to see if further intervention is needed.  -HISTORY OF TOBACCO USE: You have a history of smoking but quit 33 years ago. Your smoking history is minimal, and it is good that you have not smoked for a long time.  INSTRUCTIONS:  1. Get a chest x-ray to evaluate your lung structure. 2. Assess your nocturnal oxygen saturation without supplemental oxygen. 3. Repeat the pulmonary function test to monitor your lung function. 4. Learn how to properly use your oxygen concentrator, starting with a setting of 2 L/min. 5. Follow up with us  after completing these tests and evaluations.

## 2023-12-21 ENCOUNTER — Ambulatory Visit: Payer: Self-pay | Admitting: Pulmonary Disease

## 2023-12-23 ENCOUNTER — Encounter: Payer: Self-pay | Admitting: Pulmonary Disease

## 2023-12-23 ENCOUNTER — Other Ambulatory Visit: Payer: Self-pay | Admitting: General Practice

## 2023-12-23 ENCOUNTER — Encounter: Payer: Self-pay | Admitting: General Practice

## 2023-12-23 ENCOUNTER — Ambulatory Visit: Admitting: General Practice

## 2023-12-23 VITALS — BP 118/76 | HR 77 | Temp 99.0°F | Ht 61.5 in | Wt 180.0 lb

## 2023-12-23 DIAGNOSIS — F3170 Bipolar disorder, currently in remission, most recent episode unspecified: Secondary | ICD-10-CM

## 2023-12-23 DIAGNOSIS — J309 Allergic rhinitis, unspecified: Secondary | ICD-10-CM

## 2023-12-23 DIAGNOSIS — E782 Mixed hyperlipidemia: Secondary | ICD-10-CM | POA: Insufficient documentation

## 2023-12-23 DIAGNOSIS — L719 Rosacea, unspecified: Secondary | ICD-10-CM

## 2023-12-23 DIAGNOSIS — Z23 Encounter for immunization: Secondary | ICD-10-CM | POA: Diagnosis not present

## 2023-12-23 DIAGNOSIS — F32A Depression, unspecified: Secondary | ICD-10-CM

## 2023-12-23 DIAGNOSIS — R7303 Prediabetes: Secondary | ICD-10-CM | POA: Diagnosis not present

## 2023-12-23 DIAGNOSIS — Z803 Family history of malignant neoplasm of breast: Secondary | ICD-10-CM

## 2023-12-23 DIAGNOSIS — Z7689 Persons encountering health services in other specified circumstances: Secondary | ICD-10-CM

## 2023-12-23 DIAGNOSIS — Z9189 Other specified personal risk factors, not elsewhere classified: Secondary | ICD-10-CM

## 2023-12-23 MED ORDER — IVERMECTIN 1 % EX CREA: TOPICAL_CREAM | CUTANEOUS | 0 refills | Status: AC

## 2023-12-23 MED ORDER — ALBUTEROL SULFATE HFA 108 (90 BASE) MCG/ACT IN AERS: 2.0000 | INHALATION_SPRAY | Freq: Four times a day (QID) | RESPIRATORY_TRACT | 0 refills | Status: DC | PRN

## 2023-12-23 MED ORDER — ORACEA 40 MG PO CPDR: 40.0000 mg | DELAYED_RELEASE_CAPSULE | ORAL | 0 refills | Status: AC | PRN

## 2023-12-23 NOTE — Assessment & Plan Note (Signed)
 Controlled.   Followed by psychiatry.  Continue Lamictal 200 mg, sertraline 200 mg once daily and Trazodone 100 mg PRN.

## 2023-12-23 NOTE — Assessment & Plan Note (Signed)
 Orders placed by gyn to schedule mammogram.  She plans to call and schedule the appt.

## 2023-12-23 NOTE — Assessment & Plan Note (Signed)
 Reviewed labs from 10/2023.  Discussed at length.   Patient declines medication at this time.  Discussed the importance of monitoring diet and start exercise.  Will recheck in 3 months.  Handout given.   The 10-year ASCVD risk score (Arnett DK, et al., 2019) is: 3%   Values used to calculate the score:     Age: 63 years     Sex: Female     Is Non-Hispanic African American: No     Diabetic: No     Tobacco smoker: No     Systolic Blood Pressure: 118 mmHg     Is BP treated: No     HDL Cholesterol: 68 mg/dL     Total Cholesterol: 229 mg/dL

## 2023-12-23 NOTE — Assessment & Plan Note (Signed)
 Controlled.  Followed by pulmonology.  Refill sent for Albuterol .

## 2023-12-23 NOTE — Assessment & Plan Note (Addendum)
 EMR reviewed briefly.

## 2023-12-23 NOTE — Assessment & Plan Note (Signed)
 Uncontrolled.   Rx sent for Oracea 40 mg PRN and Ivermectin 1% cream PRN.   Consider dermatology referral.

## 2023-12-23 NOTE — Telephone Encounter (Signed)
 Left message for pt to call office

## 2023-12-23 NOTE — Patient Instructions (Signed)
 It is important that you improve your diet. Please limit carbohydrates in the form of white bread, rice, pasta, sweets, fast food, fried food, sugary drinks, etc. Increase your consumption of fresh fruits and vegetables, whole grains, lean protein.  Ensure you are consuming 64 ounces of water daily.  Low cholesterol low fat diet.   Start exercise.  Schedule mammogram.  Follow up in 3 months.   It was a pleasure meeting you!

## 2023-12-23 NOTE — Assessment & Plan Note (Signed)
 Hemoglobin A1c 6.4 in April.   Discussed the importance of starting Metformin. Patient declines at this time.   Discussed the diabetic diet and exercise.  Handouts provided to patient. F/u in 3 months.

## 2023-12-23 NOTE — Progress Notes (Signed)
 New Patient Office Visit  Subjective    Patient ID: Krystal Smith, female    DOB: 1961/05/31  Age: 63 y.o. MRN: [id number]  CC:  Chief Complaint  Patient presents with   Establish Care    Was a pt of Willis Harter, NP here at [facility]. Never established with another PCP. Sees psychiatrist. Was seeing Dermatologist and was prescribed Oracea (doxycycline). Sees GYN regularly. Was last seen in April 2025.     HPI Krystal Smith is a 63 y.o. female presents to establish care.   Previous PCP: Willis Harter, NP.   Bipolar disorder/anxiety, depression: followed by psychiatry. Currently managed on Lamictal 200 mg once daily, sertraline 200 mg once daily and Trazodone 100 mg once daily at bedtime as needed. No concerns. She follows with Dr. Kapoor every 3 months.   Rosacea: previously followed by dermatology. Was given Oracea PRN. Has been controlled but has developed a few pustules on her chin. She also uses Ivermectin 1%.   Family history of breast cancer/increased risk of breast cancer: followed by GYN. Last mammogram 2023. Last labs was in 10/2023. She has the phone number to call and schedule the mammogram. No other concerns today.   HLD- Lipid panel completed on 11/05/23 shows elevated readings. Not currently managed on medication.   Prediabetes- hemoglobin A1c was 6.4 on 11/05/23. She was not started on any medication. She has been trying to monitor her diet. Plans to start exercise soon.   Outpatient Encounter Medications as of 12/23/2023  Medication Sig   aspirin-acetaminophen -caffeine  (EXCEDRIN MIGRAINE) 250-250-65 MG tablet Take 1 tablet by mouth every 6 (six) hours as needed for headache.   ibuprofen (ADVIL) 200 MG tablet Take 200 mg by mouth as needed.   lamoTRIgine (LAMICTAL) 200 MG tablet Take 200 mg by mouth daily.    Melatonin 3 MG TABS Take 12 mg by mouth as needed.   sertraline (ZOLOFT) 100 MG tablet Take 200 mg by mouth every morning.   traZODone  (DESYREL) 100 MG tablet Take 1 tablet by mouth as needed.   valACYclovir (VALTREX) 1000 MG tablet Take 1 tablet by mouth as needed.   [DISCONTINUED] albuterol  (VENTOLIN  HFA) 108 (90 Base) MCG/ACT inhaler Inhale 2 puffs into the lungs every 6 (six) hours as needed for wheezing or shortness of breath.   [DISCONTINUED] Ivermectin (SOOLANTRA) 1 % CREA Apply topically daily.   [DISCONTINUED] ORACEA 40 MG capsule Take 40 mg by mouth as needed.   albuterol  (VENTOLIN  HFA) 108 (90 Base) MCG/ACT inhaler Inhale 2 puffs into the lungs every 6 (six) hours as needed for wheezing or shortness of breath.   Ivermectin (SOOLANTRA) 1 % CREA Apply topically daily.   ORACEA 40 MG CPDR Take 40 mg by mouth as needed.   No facility-administered encounter medications on file as of 12/23/2023.    Past Medical History:  Diagnosis Date   Allergic rhinitis    Anxiety    Asthma    Bacterial pneumonia 12/2008   HOSP X2D   Bipolar disorder (HCC)    BRCA negative 2013; 8/19   2013 BRCA neg; 2019 MyRisk neg except MSH3 VUS   Depression    Family history of breast cancer    mother age 62, mat aunt-two primary br cas in either breast-first cancer age 79, mgac breast and pancreatic   Fibroadenoma of breast    IBS (irritable bowel syndrome)    Increased risk of breast cancer 02/2018   IBIS=24.7%/riskscore=37.6%   Infertility  Migraine    Rosacea    Vitamin D deficiency     Past Surgical History:  Procedure Laterality Date   BREAST BIOPSY Right 2000   fibroadenoma, core biopsy.   BREAST BIOPSY Left 06/21/2014   stereotatic, fibrocystic changes with microcalcifications. BRCA testing neagative   BREAST CYST ASPIRATION Left 04/2006   BREAST CYST ASPIRATION Right 2007   CHOLECYSTECTOMY  10/2019   NASAL SINUS SURGERY  2004   PECTUS EXCAVATUM REPAIR     age 9 at [facility]   Repair left ankle fracture     TONSILLECTOMY     WISDOM TOOTH EXTRACTION      Family History  Problem Relation Age of Onset   Breast  cancer Mother 14   Hypertension Mother    Thyroid  disease Mother    Osteoporosis Mother    Cancer Father        lymphoma; Squamous cell of face   Dementia Father    Heart disease Father        MI AND CABG   Heart attack Brother    Breast cancer Maternal Aunt 50       Bilateral Cancer/Mastectomy   Cancer Other        cancerous tumor on kidney   Pancreatic cancer Other 48    Social History   Socioeconomic History   Marital status: Married    Spouse name: Not on file   Number of children: 1   Years of education: 14   Highest education level: Not on file  Occupational History   Occupation: HOMEMAKER  Tobacco Use   Smoking status: Former    Current packs/day: 0.00    Types: Cigarettes    Quit date: 1984    Years since quitting: 41.4   Smokeless tobacco: Never   Tobacco comments:    Quit in 1994- 05/14/2022- khj  patient states she has not smoked in 30 years  Vaping Use   Vaping status: Never Used  Substance and Sexual Activity   Alcohol use: Yes    Alcohol/week: 0.0 standard drinks of alcohol    Comment: 0-1 BEER   Drug use: No   Sexual activity: Yes    Partners: Male    Birth control/protection: Post-menopausal  Other Topics Concern   Not on file  Social History Narrative   Not on file   Social Drivers of Health   Financial Resource Strain: Not on file  Food Insecurity: Not on file  Transportation Needs: Not on file  Physical Activity: Not on file  Stress: Not on file  Social Connections: Not on file  Intimate Partner Violence: Not on file    Review of Systems  Constitutional:  Negative for chills and fever.  Respiratory:  Negative for shortness of breath.   Cardiovascular:  Negative for chest pain.  Gastrointestinal:  Negative for abdominal pain, constipation, diarrhea, heartburn, nausea and vomiting.  Genitourinary:  Negative for dysuria, frequency and urgency.  Skin:  Positive for rash.  Neurological:  Negative for dizziness and headaches.   Endo/Heme/Allergies:  Negative for polydipsia.  Psychiatric/Behavioral:  Negative for depression and suicidal ideas. The patient is not nervous/anxious.         Objective    BP 118/76 (BP Location: Left Arm, Patient Position: Sitting, Cuff Size: Large)   Pulse 77   Temp 99 F (37.2 C) (Oral)   Ht 5' 1.5" (1.562 m)   Wt 180 lb (81.6 kg)   SpO2 94%   BMI 33.46 kg/m   Physical  Exam Vitals and nursing note reviewed.  Constitutional:      Appearance: Normal appearance.  Cardiovascular:     Rate and Rhythm: Normal rate and regular rhythm.     Pulses: Normal pulses.     Heart sounds: Normal heart sounds.  Pulmonary:     Effort: Pulmonary effort is normal.     Breath sounds: Normal breath sounds.  Skin:    Findings: Rash present. Rash is pustular.       Neurological:     Mental Status: She is alert and oriented to person, place, and time.  Psychiatric:        Mood and Affect: Mood normal.        Behavior: Behavior normal.        Thought Content: Thought content normal.        Judgment: Judgment normal.         Assessment & Plan:  Rosacea Assessment & Plan: Uncontrolled.   Rx sent for Oracea 40 mg PRN and Ivermectin 1% cream PRN.   Consider dermatology referral.   Orders: -     Oracea; Take 40 mg by mouth as needed.  Dispense: 14 capsule; Refill: 0 -     Ivermectin; Apply topically daily.  Dispense: 45 g; Refill: 0  Bipolar affective disorder in remission [facility]) Assessment & Plan: Controlled.   Followed by psychiatry.  Continue Lamictal 200 mg, sertraline 200 mg once daily and Trazodone 100 mg PRN.    Allergic rhinitis, unspecified seasonality, unspecified trigger Assessment & Plan: Controlled.  Followed by pulmonology.  Refill sent for Albuterol .  Orders: -     Albuterol  Sulfate HFA; Inhale 2 puffs into the lungs every 6 (six) hours as needed for wheezing or shortness of breath.  Dispense: 1 each; Refill: 0  Prediabetes Assessment &  Plan: Hemoglobin A1c 6.4 in April.   Discussed the importance of starting Metformin. Patient declines at this time.   Discussed the diabetic diet and exercise.  Handouts provided to patient. F/u in 3 months.   Mixed hyperlipidemia Assessment & Plan: Reviewed labs from 10/2023.  Discussed at length.   Patient declines medication at this time.  Discussed the importance of monitoring diet and start exercise.  Will recheck in 3 months.  Handout given.   The 10-year ASCVD risk score (Arnett DK, et al., 2019) is: 3%   Values used to calculate the score:     Age: 15 years     Sex: Female     Is Non-Hispanic African American: No     Diabetic: No     Tobacco smoker: No     Systolic Blood Pressure: 118 mmHg     Is BP treated: No     HDL Cholesterol: 68 mg/dL     Total Cholesterol: 229 mg/dL    Depression, unspecified depression type Assessment & Plan: Controlled.   Followed by psychiatry.  Continue Lamictal 200 mg, sertraline 200 mg once daily and Trazodone 100 mg PRN.    Family history of breast cancer Assessment & Plan: Orders placed by gyn to schedule mammogram.  She plans to call and schedule the appt.   Increased risk of breast cancer Assessment & Plan: Orders placed by gyn to schedule mammogram.  She plans to call and schedule the appt.   Establishing care with new doctor, encounter for Assessment & Plan: EMR reviewed briefly.    Need for Tdap vaccination -     Tdap vaccine greater than or equal to 7yo IM  Return in about 3 months (around 03/24/2024) for chronic care management.Jolanda Nation, NP

## 2023-12-26 NOTE — Telephone Encounter (Signed)
 Spoke to pt. She said she still has another tube and did not want to change right now.

## 2023-12-31 ENCOUNTER — Ambulatory Visit: Admitting: Anesthesiology

## 2023-12-31 ENCOUNTER — Encounter: Payer: Self-pay | Admitting: Gastroenterology

## 2023-12-31 ENCOUNTER — Ambulatory Visit
Admission: RE | Admit: 2023-12-31 | Discharge: 2023-12-31 | Disposition: A | Discharge status: Home / Self Care | Attending: Gastroenterology | Admitting: Gastroenterology

## 2023-12-31 ENCOUNTER — Encounter
Admission: RE | Disposition: A | Discharge status: Home / Self Care | Payer: Self-pay | Source: Home / Self Care | Attending: Gastroenterology

## 2023-12-31 ENCOUNTER — Other Ambulatory Visit: Payer: Self-pay

## 2023-12-31 DIAGNOSIS — Z87891 Personal history of nicotine dependence: Secondary | ICD-10-CM | POA: Diagnosis not present

## 2023-12-31 DIAGNOSIS — K640 First degree hemorrhoids: Secondary | ICD-10-CM | POA: Diagnosis not present

## 2023-12-31 DIAGNOSIS — G4730 Sleep apnea, unspecified: Secondary | ICD-10-CM | POA: Insufficient documentation

## 2023-12-31 DIAGNOSIS — J4489 Other specified chronic obstructive pulmonary disease: Secondary | ICD-10-CM | POA: Diagnosis not present

## 2023-12-31 DIAGNOSIS — K5730 Diverticulosis of large intestine without perforation or abscess without bleeding: Secondary | ICD-10-CM | POA: Diagnosis not present

## 2023-12-31 DIAGNOSIS — I10 Essential (primary) hypertension: Secondary | ICD-10-CM | POA: Diagnosis not present

## 2023-12-31 DIAGNOSIS — D123 Benign neoplasm of transverse colon: Secondary | ICD-10-CM | POA: Insufficient documentation

## 2023-12-31 DIAGNOSIS — F319 Bipolar disorder, unspecified: Secondary | ICD-10-CM | POA: Insufficient documentation

## 2023-12-31 DIAGNOSIS — F418 Other specified anxiety disorders: Secondary | ICD-10-CM | POA: Diagnosis not present

## 2023-12-31 DIAGNOSIS — J45909 Unspecified asthma, uncomplicated: Secondary | ICD-10-CM | POA: Insufficient documentation

## 2023-12-31 DIAGNOSIS — D122 Benign neoplasm of ascending colon: Secondary | ICD-10-CM | POA: Insufficient documentation

## 2023-12-31 DIAGNOSIS — K635 Polyp of colon: Secondary | ICD-10-CM | POA: Diagnosis not present

## 2023-12-31 DIAGNOSIS — Z8249 Family history of ischemic heart disease and other diseases of the circulatory system: Secondary | ICD-10-CM | POA: Diagnosis not present

## 2023-12-31 DIAGNOSIS — Z1211 Encounter for screening for malignant neoplasm of colon: Secondary | ICD-10-CM | POA: Insufficient documentation

## 2023-12-31 HISTORY — PX: POLYPECTOMY: SHX149

## 2023-12-31 HISTORY — PX: COLONOSCOPY: SHX5424

## 2023-12-31 SURGERY — COLONOSCOPY
Anesthesia: General

## 2023-12-31 MED ORDER — PROPOFOL 10 MG/ML IV BOLUS
INTRAVENOUS | Status: DC | PRN
  Administered 2023-12-31 (×3): 50 mg via INTRAVENOUS

## 2023-12-31 MED ORDER — ACETAMINOPHEN 160 MG/5ML PO SUSP
1000.0000 mg | Freq: Once | ORAL | Status: DC
  Filled 2023-12-31: qty 35

## 2023-12-31 MED ORDER — ACETAMINOPHEN 500 MG PO TABS
ORAL_TABLET | ORAL | Status: AC
Start: 2023-12-31 — End: 2023-12-31
  Filled 2023-12-31: qty 2

## 2023-12-31 MED ORDER — ONDANSETRON HCL 4 MG/2ML IJ SOLN
INTRAMUSCULAR | Status: AC
  Filled 2023-12-31: qty 2

## 2023-12-31 MED ORDER — LIDOCAINE HCL (PF) 2 % IJ SOLN
INTRAMUSCULAR | Status: AC
  Filled 2023-12-31: qty 5

## 2023-12-31 MED ORDER — LIDOCAINE HCL (CARDIAC) PF 100 MG/5ML IV SOSY
PREFILLED_SYRINGE | INTRAVENOUS | Status: DC | PRN
  Administered 2023-12-31: 40 mg via INTRAVENOUS

## 2023-12-31 MED ORDER — PROPOFOL 500 MG/50ML IV EMUL
INTRAVENOUS | Status: DC | PRN
  Administered 2023-12-31: 75 ug/kg/min via INTRAVENOUS

## 2023-12-31 MED ORDER — SODIUM CHLORIDE 0.9 % IV SOLN: INTRAVENOUS | Status: DC

## 2023-12-31 MED ORDER — ACETAMINOPHEN 500 MG PO TABS
1000.0000 mg | ORAL_TABLET | Freq: Once | ORAL | Status: AC
  Administered 2023-12-31: 1000 mg via ORAL

## 2023-12-31 MED ORDER — ONDANSETRON HCL 4 MG/2ML IJ SOLN
INTRAMUSCULAR | Status: DC | PRN
  Administered 2023-12-31: 4 mg via INTRAVENOUS

## 2023-12-31 NOTE — Anesthesia Preprocedure Evaluation (Signed)
 Anesthesia Evaluation  Patient identified by MRN, date of birth, ID band Patient awake    Reviewed: Allergy & Precautions, NPO status , Patient's Chart, lab work & pertinent test results  History of Anesthesia Complications Negative for: history of anesthetic complications  Airway Mallampati: II  TM Distance: >3 FB Neck ROM: Full    Dental  (+) Poor Dentition   Pulmonary asthma , sleep apnea , neg COPD, Patient abstained from smoking.Not current smoker, former smoker   Pulmonary exam normal breath sounds clear to auscultation       Cardiovascular Exercise Tolerance: Poor METS(-) hypertension+ DOE  (-) CAD and (-) Past MI (-) dysrhythmias  Rhythm:Regular Rate:Normal - Systolic murmurs TTE in 2021 unremarkable   Neuro/Psych  Headaches PSYCHIATRIC DISORDERS Anxiety Depression Bipolar Disorder      GI/Hepatic ,neg GERD  ,,(+)     (-) substance abuse    Endo/Other  neg diabetes    Renal/GU negative Renal ROS     Musculoskeletal   Abdominal  (+) + obese  Peds  Hematology   Anesthesia Other Findings Past Medical History: No date: Allergic rhinitis No date: Anxiety No date: Asthma 12/2008: Bacterial pneumonia     Comment:  HOSP X2D No date: Bipolar disorder (HCC) 2013; 8/19: BRCA negative     Comment:  2013 BRCA neg; 2019 MyRisk neg except MSH3 VUS No date: Depression No date: Family history of breast cancer     Comment:  mother age 57, mat aunt-two primary br cas in either               breast-first cancer age 54, mgac breast and pancreatic No date: Fibroadenoma of breast No date: IBS (irritable bowel syndrome) 02/2018: Increased risk of breast cancer     Comment:  IBIS=24.7%/riskscore=37.6% No date: Infertility No date: Migraine No date: Rosacea No date: Vitamin D deficiency  Reproductive/Obstetrics                             Anesthesia Physical Anesthesia Plan  ASA:  3  Anesthesia Plan: General   Post-op Pain Management: Minimal or no pain anticipated   Induction: Intravenous  PONV Risk Score and Plan: 3 and Propofol infusion, TIVA and Ondansetron  Airway Management Planned: Nasal Cannula  Additional Equipment: None  Intra-op Plan:   Post-operative Plan:   Informed Consent: I have reviewed the patients History and Physical, chart, labs and discussed the procedure including the risks, benefits and alternatives for the proposed anesthesia with the patient or authorized representative who has indicated his/her understanding and acceptance.     Dental advisory given  Plan Discussed with: CRNA and Surgeon  Anesthesia Plan Comments: (Discussed risks of anesthesia with patient, including possibility of difficulty with spontaneous ventilation under anesthesia necessitating airway intervention, PONV, and rare risks such as cardiac or respiratory or neurological events, and allergic reactions. Discussed the role of CRNA in patient's perioperative care. Patient understands.)       Anesthesia Quick Evaluation

## 2023-12-31 NOTE — H&P (Signed)
 Krystal Sink, MD [facility] [address], [address] [place], [place] [address] Phone: [phone] Fax : [phone]  Primary Care Physician:  Jolanda Nation, NP Primary Gastroenterologist:  Dr. Ole Berkeley  Pre-Procedure History & Physical: HPI:  Krystal Smith is a 63 y.o. female is here for a screening colonoscopy.   Past Medical History:  Diagnosis Date   Allergic rhinitis    Anxiety    Asthma    Bacterial pneumonia 12/2008   HOSP X2D   Bipolar disorder (HCC)    BRCA negative 2013; 8/19   2013 BRCA neg; 2019 MyRisk neg except MSH3 VUS   Depression    Family history of breast cancer    mother age 52, mat aunt-two primary br cas in either breast-first cancer age 56, mgac breast and pancreatic   Fibroadenoma of breast    IBS (irritable bowel syndrome)    Increased risk of breast cancer 02/2018   IBIS=24.7%/riskscore=37.6%   Infertility    Migraine    Rosacea    Vitamin D deficiency     Past Surgical History:  Procedure Laterality Date   BREAST BIOPSY Right 2000   fibroadenoma, core biopsy.   BREAST BIOPSY Left 06/21/2014   stereotatic, fibrocystic changes with microcalcifications. BRCA testing neagative   BREAST CYST ASPIRATION Left 04/2006   BREAST CYST ASPIRATION Right 2007   CHOLECYSTECTOMY  10/2019   NASAL SINUS SURGERY  2004   PECTUS EXCAVATUM REPAIR     age 50 at [facility]   Repair left ankle fracture     TONSILLECTOMY     WISDOM TOOTH EXTRACTION      Prior to Admission medications   Medication Sig Start Date End Date Taking? Authorizing Provider  Ivermectin  (SOOLANTRA ) 1 % CREA Apply topically daily. 12/23/23  Yes Jolanda Nation, NP  lamoTRIgine (LAMICTAL) 200 MG tablet Take 200 mg by mouth daily.  11/20/13  Yes [provider]  ORACEA  40 MG CPDR Take 40 mg by mouth as needed. 12/23/23  Yes Jolanda Nation, NP  sertraline (ZOLOFT) 100 MG tablet Take 200 mg by mouth every morning. 10/09/23  Yes [provider]  traZODone (DESYREL) 100 MG tablet Take  1 tablet by mouth as needed. 01/10/18  Yes [provider]  albuterol  (VENTOLIN  HFA) 108 (90 Base) MCG/ACT inhaler Inhale 2 puffs into the lungs every 6 (six) hours as needed for wheezing or shortness of breath. 12/23/23   Jolanda Nation, NP  aspirin-acetaminophen -caffeine  (EXCEDRIN MIGRAINE) 250-250-65 MG tablet Take 1 tablet by mouth every 6 (six) hours as needed for headache.    [provider]  ibuprofen (ADVIL) 200 MG tablet Take 200 mg by mouth as needed.    [provider]  Melatonin 3 MG TABS Take 12 mg by mouth as needed.    [provider]  valACYclovir (VALTREX) 1000 MG tablet Take 1 tablet by mouth as needed. 01/20/18   [provider]    Allergies as of 11/21/2023 - Review Complete 11/08/2023  Allergen Reaction Noted   Amitriptyline Other (See Comments) 06/29/2014   Amoxil [amoxicillin]  11/25/2013   Corn-containing products  02/04/2018   Depakote [divalproex sodium] Other (See Comments) 05/28/2015   Erythromycin  11/25/2013   Ultracet [tramadol-acetaminophen ] Itching 06/29/2014   Imitrex [sumatriptan] Palpitations 11/25/2013   Skelaxin [metaxalone] Palpitations 11/25/2013   Tetracyclines & related Rash 02/04/2018   Venlafaxine Other (See Comments) 10/14/2015    Family History  Problem Relation Age of Onset   Breast cancer Mother 3   Hypertension Mother  Thyroid  disease Mother    Osteoporosis Mother    Cancer Father        lymphoma; Squamous cell of face   Dementia Father    Heart disease Father        MI AND CABG   Heart attack Brother    Breast cancer Maternal Aunt 50       Bilateral Cancer/Mastectomy   Cancer Other        cancerous tumor on kidney   Pancreatic cancer Other 61    Social History   Socioeconomic History   Marital status: Married    Spouse name: Not on file   Number of children: 1   Years of education: 14   Highest education level: Not on file  Occupational History   Occupation: HOMEMAKER   Tobacco Use   Smoking status: Former    Current packs/day: 0.00    Types: Cigarettes    Quit date: 1984    Years since quitting: 41.4   Smokeless tobacco: Never   Tobacco comments:    Quit in 1994- 05/14/2022- khj  patient states she has not smoked in 30 years  Vaping Use   Vaping status: Never Used  Substance and Sexual Activity   Alcohol use: Yes    Alcohol/week: 0.0 standard drinks of alcohol    Comment: 0-1 BEER   Drug use: No   Sexual activity: Yes    Partners: Male    Birth control/protection: Post-menopausal  Other Topics Concern   Not on file  Social History Narrative   Not on file   Social Drivers of Health   Financial Resource Strain: Not on file  Food Insecurity: Not on file  Transportation Needs: Not on file  Physical Activity: Not on file  Stress: Not on file  Social Connections: Not on file  Intimate Partner Violence: Not on file    Review of Systems: See HPI, otherwise negative ROS  Physical Exam: BP (!) 142/74   Pulse 77   Temp (!) 96.4 F (35.8 C) (Tympanic)   Resp 18   Ht 5' 1 (1.549 m)   Wt 81.4 kg   SpO2 93%   BMI 33.90 kg/m  General:   Alert,  pleasant and cooperative in NAD Head:  Normocephalic and atraumatic. Neck:  Supple; no masses or thyromegaly. Lungs:  Clear throughout to auscultation.    Heart:  Regular rate and rhythm. Abdomen:  Soft, nontender and nondistended. Normal bowel sounds, without guarding, and without rebound.   Neurologic:  Alert and  oriented x4;  grossly normal neurologically.  Impression/Plan: Krystal Smith is now here to undergo a screening colonoscopy.  Risks, benefits, and alternatives regarding colonoscopy have been reviewed with the patient.  Questions have been answered.  All parties agreeable.

## 2023-12-31 NOTE — Anesthesia Postprocedure Evaluation (Signed)
 Anesthesia Post Note  Patient: Krystal Smith  Procedure(s) Performed: COLONOSCOPY POLYPECTOMY, INTESTINE  Patient location during evaluation: Endoscopy Anesthesia Type: General Level of consciousness: awake and alert Pain management: pain level controlled Vital Signs Assessment: post-procedure vital signs reviewed and stable Respiratory status: spontaneous breathing, nonlabored ventilation, respiratory function stable and patient connected to nasal cannula oxygen Cardiovascular status: blood pressure returned to baseline and stable Postop Assessment: no apparent nausea or vomiting Anesthetic complications: no   No notable events documented.   Last Vitals:  Vitals:   12/31/23 0826 12/31/23 0836  BP: (!) 129/51 138/60  Pulse: 96 87  Resp: 13 15  Temp: (!) 35.6 C   SpO2: 95% 94%    Last Pain:  Vitals:   12/31/23 0826  TempSrc: Temporal  PainSc:                  Lattie Poli

## 2023-12-31 NOTE — Transfer of Care (Signed)
 Immediate Anesthesia Transfer of Care Note  Patient: Krystal Smith  Procedure(s) Performed: COLONOSCOPY POLYPECTOMY, INTESTINE  Patient Location: PACU  Anesthesia Type:MAC  Level of Consciousness: awake  Airway & Oxygen Therapy: Patient Spontanous Breathing  Post-op Assessment: Report given to RN and Post -op Vital signs reviewed and stable  Post vital signs: Reviewed and stable  Last Vitals:  Vitals Value Taken Time  BP 129/51 12/31/23 08:26  Temp 35.6 C 12/31/23 08:26  Pulse 96 12/31/23 08:26  Resp 13 12/31/23 08:26  SpO2 95 % 12/31/23 08:26    Last Pain:  Vitals:   12/31/23 0826  TempSrc: Temporal  PainSc:          Complications: No notable events documented.

## 2023-12-31 NOTE — Op Note (Signed)
 [facility] Gastroenterology Patient Name: Krystal Smith Procedure Date: 12/31/2023 7:58 AM MRN: [id number] Account #: [id number] Date of Birth: 1960/09/09 Admit Type: Outpatient Age: 63 Room: [facility] ENDO ROOM 4 Gender: Female Note Status: Finalized Instrument Name: Charlyn Cooley [id number] Procedure:             Colonoscopy Indications:           Screening for colorectal malignant neoplasm Providers:             Marnee Sink MD, MD Referring MD:          Jolanda Nation (Referring MD) Medicines:             Propofol per Anesthesia Complications:         No immediate complications. Procedure:             Pre-Anesthesia Assessment:                        - Prior to the procedure, a History and Physical was                         performed, and patient medications and allergies were                         reviewed. The patient's tolerance of previous                         anesthesia was also reviewed. The risks and benefits                         of the procedure and the sedation options and risks                         were discussed with the patient. All questions were                         answered, and informed consent was obtained. Prior                         Anticoagulants: The patient has taken no anticoagulant                         or antiplatelet agents. ASA Grade Assessment: II - A                         patient with mild systemic disease. After reviewing                         the risks and benefits, the patient was deemed in                         satisfactory condition to undergo the procedure.                        After obtaining informed consent, the colonoscope was                         passed under direct vision. Throughout the procedure,  the patient's blood pressure, pulse, and oxygen                         saturations were monitored continuously. The                         Colonoscope was introduced through  the anus and                         advanced to the the cecum, identified by appendiceal                         orifice and ileocecal valve. The colonoscopy was                         performed without difficulty. The patient tolerated                         the procedure well. The quality of the bowel                         preparation was excellent. Findings:      The perianal and digital rectal examinations were normal.      A 4 mm polyp was found in the ascending colon. The polyp was sessile.       The polyp was removed with a cold snare. Resection and retrieval were       complete.      A 7 mm polyp was found in the transverse colon. The polyp was sessile.       The polyp was removed with a cold snare. Resection and retrieval were       complete.      Multiple small-mouthed diverticula were found in the sigmoid colon and       descending colon.      Non-bleeding internal hemorrhoids were found during retroflexion. The       hemorrhoids were Grade I (internal hemorrhoids that do not prolapse). Impression:            - One 4 mm polyp in the ascending colon, removed with                         a cold snare. Resected and retrieved.                        - One 7 mm polyp in the transverse colon, removed with                         a cold snare. Resected and retrieved.                        - Diverticulosis in the sigmoid colon and in the                         descending colon.                        - Non-bleeding internal hemorrhoids. Recommendation:        - Discharge patient to home.                        -  Resume previous diet.                        - Continue present medications.                        - Await pathology results.                        - If the pathology report reveals adenomatous tissue,                         then repeat the colonoscopy for surveillance in 7                         years. Procedure Code(s):     --- Professional ---                         [phone], Colonoscopy, flexible; with removal of                         tumor(s), polyp(s), or other lesion(s) by snare                         technique Diagnosis Code(s):     --- Professional ---                        Z12.11, Encounter for screening for malignant neoplasm                         of colon                        D12.3, Benign neoplasm of transverse colon (hepatic                         flexure or splenic flexure) CPT copyright 2022 American Medical Association. All rights reserved. The codes documented in this report are preliminary and upon coder review may  be revised to meet current compliance requirements. Marnee Sink MD, MD 12/31/2023 8:23:37 AM This report has been signed electronically. Number of Addenda: 0 Note Initiated On: 12/31/2023 7:58 AM Scope Withdrawal Time: 0 hours 7 minutes 20 seconds  Total Procedure Duration: 0 hours 9 minutes 3 seconds  Estimated Blood Loss:  Estimated blood loss: none.      [facility]

## 2024-01-01 ENCOUNTER — Ambulatory Visit: Payer: Self-pay | Admitting: Gastroenterology

## 2024-01-01 LAB — SURGICAL PATHOLOGY

## 2024-01-08 DIAGNOSIS — F5105 Insomnia due to other mental disorder: Secondary | ICD-10-CM | POA: Diagnosis not present

## 2024-01-08 DIAGNOSIS — F410 Panic disorder [episodic paroxysmal anxiety] without agoraphobia: Secondary | ICD-10-CM | POA: Diagnosis not present

## 2024-01-08 DIAGNOSIS — F39 Unspecified mood [affective] disorder: Secondary | ICD-10-CM | POA: Diagnosis not present

## 2024-01-22 ENCOUNTER — Telehealth: Payer: Self-pay

## 2024-01-22 DIAGNOSIS — R0600 Dyspnea, unspecified: Secondary | ICD-10-CM

## 2024-01-22 NOTE — Telephone Encounter (Signed)
 Overnight Pulse Oximetry Test-on room air.  Test started on 01/01/2024 Test ended on 01/02/2024  Dr. Tamea reviewed test and reports patient does need oxygen at night, 2L. Patient owns oxygen concentrator.  Patient advised that she will need to repeat test after 2 weeks on oxygen.  Patient reports she will call back first week of August. She will have new insurance. She is requesting order for ONO to be dispensed by local DME. NFN.

## 2024-01-23 ENCOUNTER — Telehealth: Payer: Self-pay | Admitting: Certified Nurse Midwife

## 2024-01-23 NOTE — Telephone Encounter (Signed)
 Hello Krystal Smith,  Patient is asking for a call from you in regards to mammogram

## 2024-02-01 IMAGING — MG DIGITAL DIAGNOSTIC BILAT W/ TOMO W/ CAD
7 of 14 series · 7 of 40 positions shown · non-contrast
Comparison: Previous exam(s).
COMPARISON: Previous exam(s).

Addendum:
CLINICAL DATA: Patient complains of 2 palpable abnormalities in the
right breast 2 palpable abnormalities in the left breast. Strong
family history of breast cancer. Patient mother and maternal aunt
both had breast cancer.



[R CC synth-2D (1 of 2)]
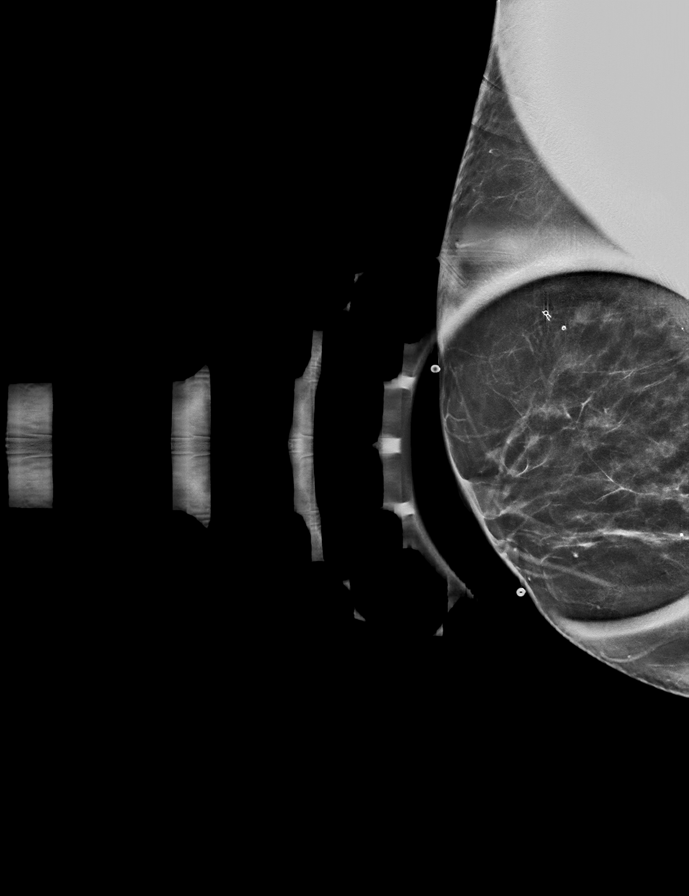

[L MLO synth-2D (1 of 2)]
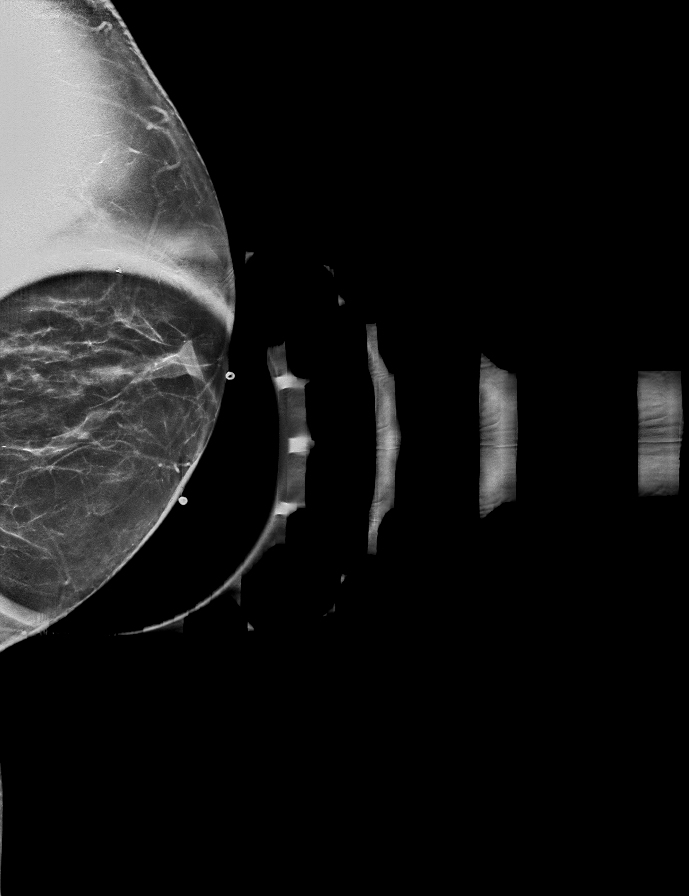

[L CC synth-2D (1 of 2)]
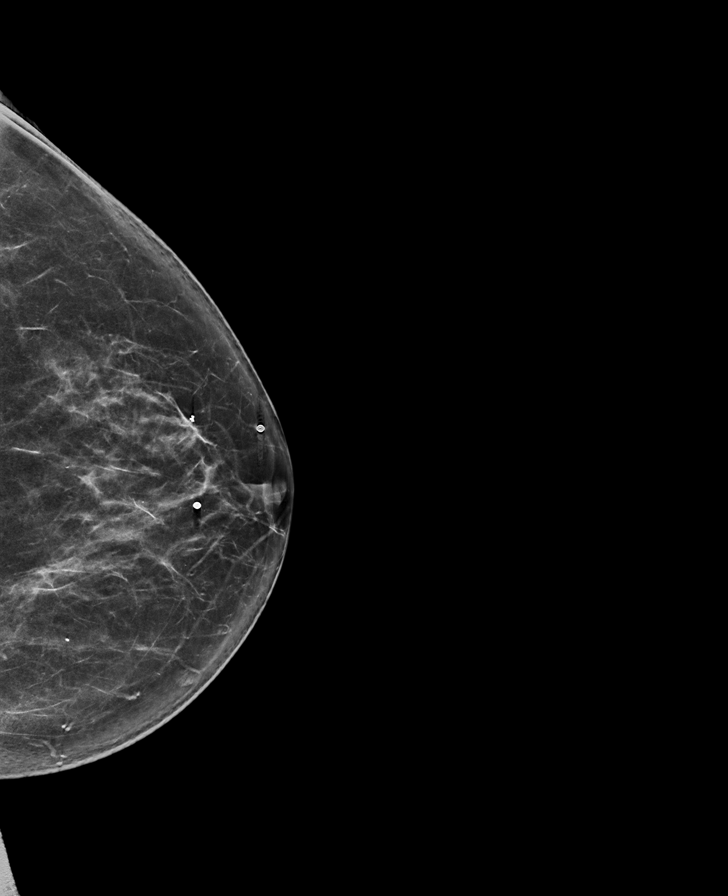

[L CC synth-2D (2 of 2)]
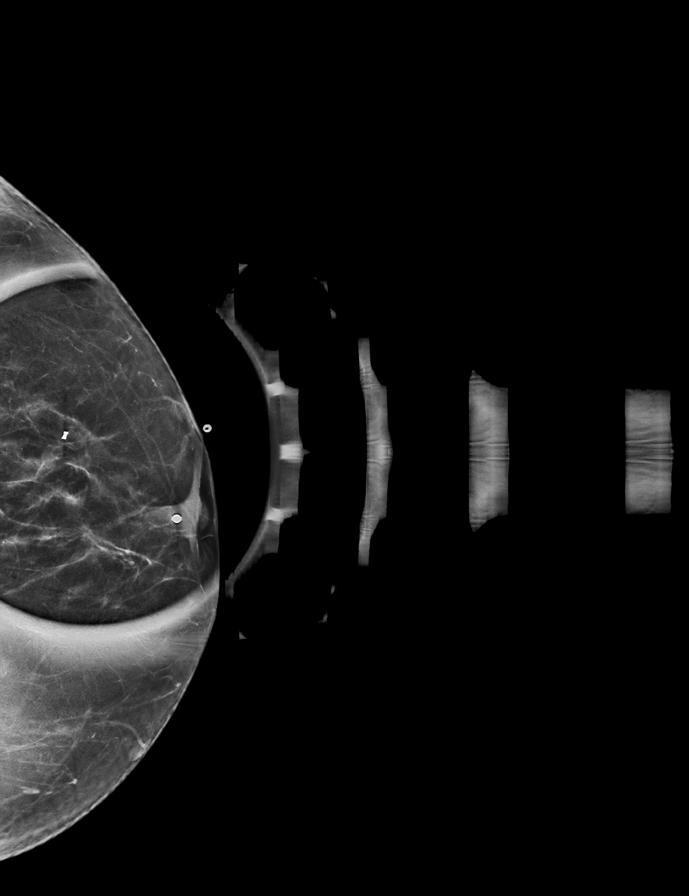

[L MLO synth-2D (2 of 2)]
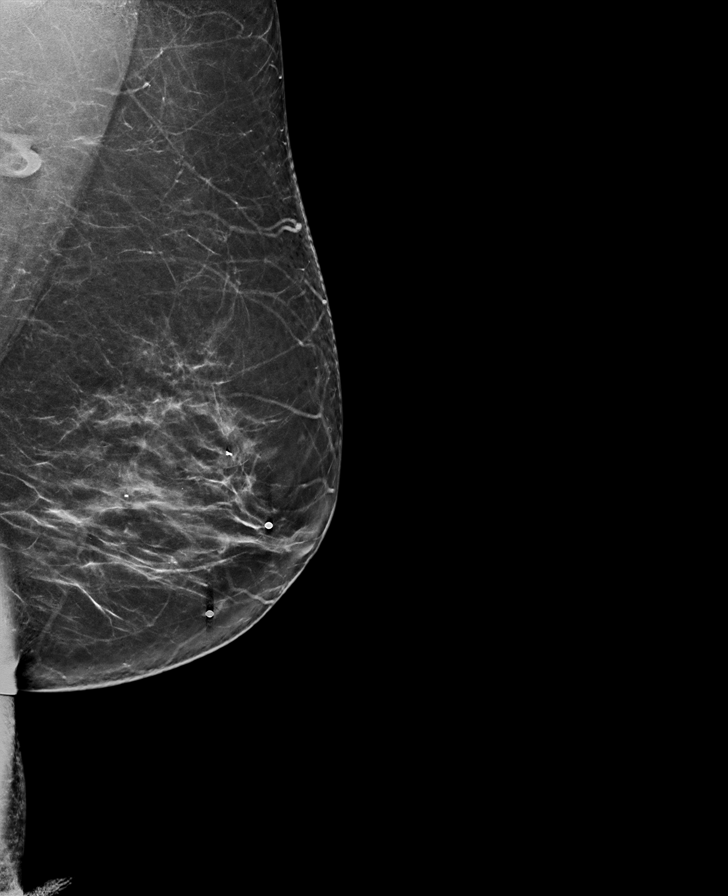

[R MLO synth-2D]
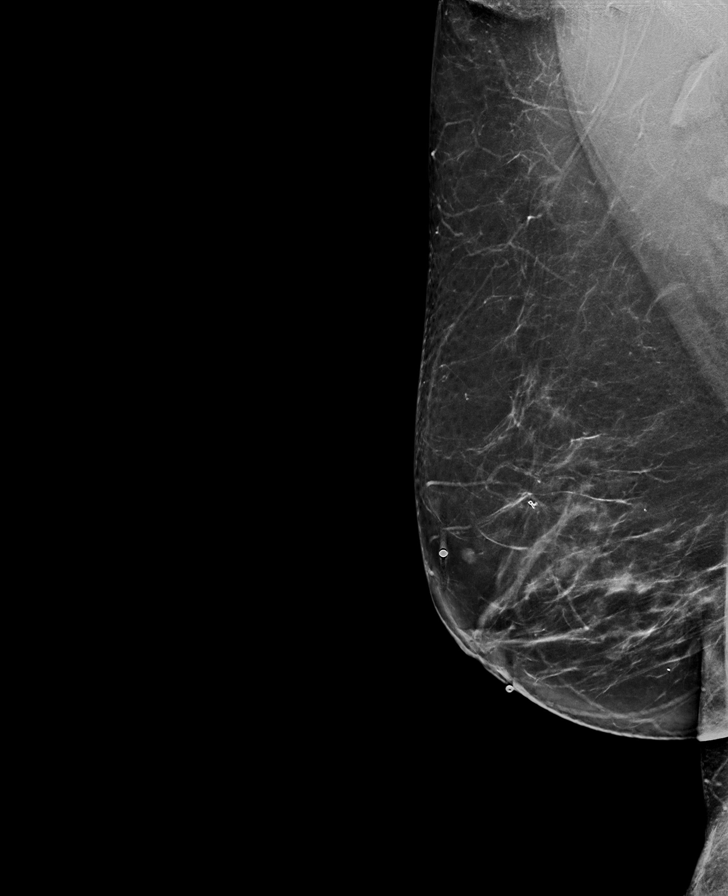

[R CC synth-2D (2 of 2)]
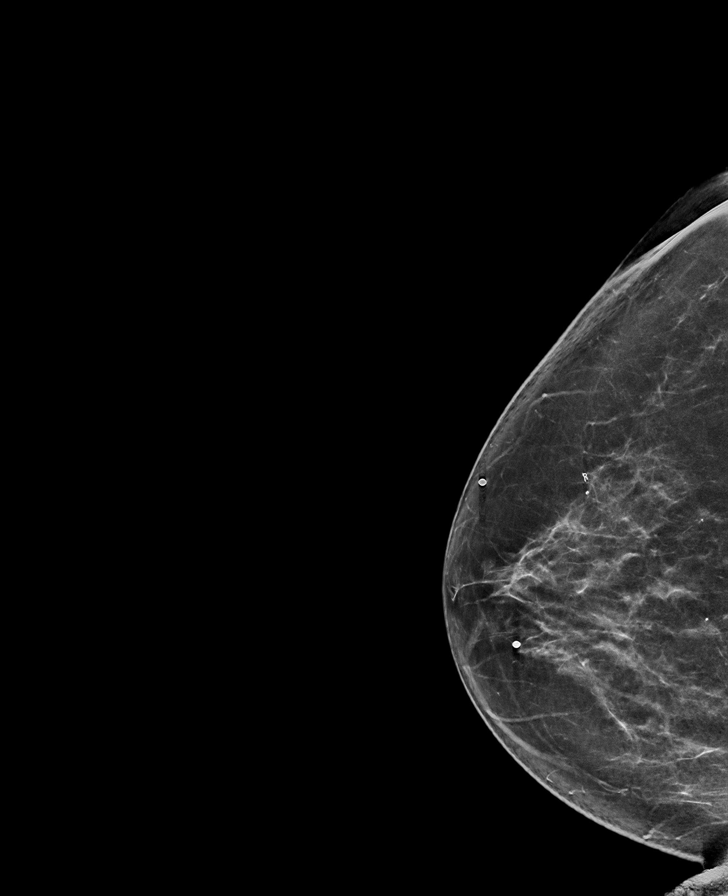

[7 of 40 positions shown; findings below may reference images not displayed]

ACR Breast Density Category b: There are scattered areas of
fibroglandular density.
FINDINGS: In the 12 o'clock region of the right breast there is a
circumscribed 3 mm mass. No suspicious mass or malignant type
microcalcifications identified in either breast.

On physical exam, I do not palpate a mass in the area of clinical
concern in the right breast at 8 o'clock 1 cm from the nipple or
retroareolar region of the right breast. I do not palpate a mass in
the areas of clinical concern in the left breast at [DATE] 1 cm from
the nipple, 6 o'clock 1 cm from the nipple or 4 o'clock area 1 cm
from the nipple.

Targeted ultrasound is performed, showing normal tissue in the right
breast at 8 o'clock 1 cm from the nipple and the retroareolar region
of the breast. In the right breast at 12 o'clock 1 cm from the
nipple is likely a cluster of cysts (apocrine metaplasia) measuring
4 x 2 x 3 mm.

Targeted ultrasound of the left breast is performed, showing 2 areas
of probable clusters of cysts (apocrine metaplasia) at 4 o'clock 1
cm from the nipple measuring 8 x 3 x 5 mm and 6 x 4 x 5 mm. There is
probable cluster of cysts (apocrine metaplasia) in the left breast
at 6 o'clock 1 cm from the nipple measuring 6 x 4 x 11 mm. No
sonographic abnormality is seen in the palpable area at [DATE] 1 cm
from the nipple.
IMPRESSION: Probable benign cluster of cysts in the right breast at 12 o'clock 1
cm from the nipple, 2 probable cluster of cyst in the left breast at
4 o'clock 1 cm from the nipple and in the left breast at 6 o'clock 1
cm from the nipple.

RECOMMENDATION:
Short-term interval follow-up bilateral breast ultrasound is
recommended.

The patient has a family history of breast cancer. The American
Cancer Society recommends annual MRI and mammography in patients
with an estimated lifetime risk of developing breast cancer greater
than 20 - 25%, or who are known or suspected to be positive for the
breast cancer gene.

I have discussed the findings and recommendations with the patient.
If applicable, a reminder letter will be sent to the patient
regarding the next appointment.

BI-RADS CATEGORY  3: Probably benign.

ADDENDUM:
Short-term interval follow-up bilateral breast ultrasound in 6
months is recommended.

*** End of Addendum ***
ACR Breast Density Category b: There are scattered areas of
fibroglandular density.
FINDINGS: In the 12 o'clock region of the right breast there is a
circumscribed 3 mm mass. No suspicious mass or malignant type
microcalcifications identified in either breast.

On physical exam, I do not palpate a mass in the area of clinical
concern in the right breast at 8 o'clock 1 cm from the nipple or
retroareolar region of the right breast. I do not palpate a mass in
the areas of clinical concern in the left breast at [DATE] 1 cm from
the nipple, 6 o'clock 1 cm from the nipple or 4 o'clock area 1 cm
from the nipple.

Targeted ultrasound is performed, showing normal tissue in the right
breast at 8 o'clock 1 cm from the nipple and the retroareolar region
of the breast. In the right breast at 12 o'clock 1 cm from the
nipple is likely a cluster of cysts (apocrine metaplasia) measuring
4 x 2 x 3 mm.

Targeted ultrasound of the left breast is performed, showing 2 areas
of probable clusters of cysts (apocrine metaplasia) at 4 o'clock 1
cm from the nipple measuring 8 x 3 x 5 mm and 6 x 4 x 5 mm. There is
probable cluster of cysts (apocrine metaplasia) in the left breast
at 6 o'clock 1 cm from the nipple measuring 6 x 4 x 11 mm. No
sonographic abnormality is seen in the palpable area at [DATE] 1 cm
from the nipple.
IMPRESSION: Probable benign cluster of cysts in the right breast at 12 o'clock 1
cm from the nipple, 2 probable cluster of cyst in the left breast at
4 o'clock 1 cm from the nipple and in the left breast at 6 o'clock 1
cm from the nipple.

RECOMMENDATION:
Short-term interval follow-up bilateral breast ultrasound is
recommended.

The patient has a family history of breast cancer. The American
Cancer Society recommends annual MRI and mammography in patients
with an estimated lifetime risk of developing breast cancer greater
than 20 - 25%, or who are known or suspected to be positive for the
breast cancer gene.

I have discussed the findings and recommendations with the patient.
If applicable, a reminder letter will be sent to the patient
regarding the next appointment.

BI-RADS CATEGORY  3: Probably benign.

## 2024-02-04 ENCOUNTER — Other Ambulatory Visit: Payer: Self-pay

## 2024-02-04 ENCOUNTER — Telehealth: Payer: Self-pay | Admitting: Certified Nurse Midwife

## 2024-02-04 DIAGNOSIS — Z1231 Encounter for screening mammogram for malignant neoplasm of breast: Secondary | ICD-10-CM

## 2024-02-04 NOTE — Telephone Encounter (Signed)
 Patient called about her mammogram. I called patient back to inform her that her mammogram order was placed on May 6 and that she needs to call and schedule her appointment. There was no answer LVM.

## 2024-02-04 NOTE — Telephone Encounter (Signed)
 Patient called and states that she has been trying to get an appointment with [facility] since May. Patient states whenever she has called in the past they stated they have not received the orders. Patient states she last called a month ago. Orders were faxed by me on 01/23/2024. Orders have been re faxed today 02/04/2024. Attempted to call [facility] and office is currently closed. Will try to call again on 02/05/2024.

## 2024-02-05 ENCOUNTER — Telehealth: Payer: Self-pay | Admitting: Certified Nurse Midwife

## 2024-02-05 NOTE — Telephone Encounter (Signed)
 Patient has been scheduled for Mammogram at [facility] on 02/07/2024 at 11:00am. Patient has been notified and is aware of appt.

## 2024-02-07 ENCOUNTER — Other Ambulatory Visit: Payer: Self-pay | Admitting: General Practice

## 2024-02-07 DIAGNOSIS — J309 Allergic rhinitis, unspecified: Secondary | ICD-10-CM

## 2024-02-07 DIAGNOSIS — Z1231 Encounter for screening mammogram for malignant neoplasm of breast: Secondary | ICD-10-CM | POA: Diagnosis not present

## 2024-02-07 LAB — HM MAMMOGRAPHY

## 2024-02-10 ENCOUNTER — Encounter: Payer: Self-pay | Admitting: Pulmonary Disease

## 2024-02-13 ENCOUNTER — Encounter

## 2024-02-13 ENCOUNTER — Ambulatory Visit: Admitting: Pulmonary Disease

## 2024-02-13 ENCOUNTER — Encounter: Payer: Self-pay | Admitting: Obstetrics and Gynecology

## 2024-02-20 ENCOUNTER — Encounter: Payer: Self-pay | Admitting: Obstetrics and Gynecology

## 2024-04-06 ENCOUNTER — Ambulatory Visit: Admitting: General Practice

## 2024-04-07 DIAGNOSIS — F39 Unspecified mood [affective] disorder: Secondary | ICD-10-CM | POA: Diagnosis not present

## 2024-04-07 DIAGNOSIS — F410 Panic disorder [episodic paroxysmal anxiety] without agoraphobia: Secondary | ICD-10-CM | POA: Diagnosis not present

## 2024-04-07 DIAGNOSIS — F5105 Insomnia due to other mental disorder: Secondary | ICD-10-CM | POA: Diagnosis not present

## 2024-04-09 ENCOUNTER — Ambulatory Visit: Payer: Self-pay

## 2024-04-09 ENCOUNTER — Ambulatory Visit: Admitting: General Practice

## 2024-04-09 ENCOUNTER — Ambulatory Visit
Admission: RE | Admit: 2024-04-09 | Discharge: 2024-04-09 | Disposition: A | Discharge status: Home / Self Care | Source: Ambulatory Visit | Attending: General Practice | Admitting: General Practice

## 2024-04-09 ENCOUNTER — Encounter: Payer: Self-pay | Admitting: General Practice

## 2024-04-09 VITALS — BP 110/80 | HR 86 | Temp 97.9°F | Ht 61.0 in | Wt 183.0 lb

## 2024-04-09 DIAGNOSIS — G43909 Migraine, unspecified, not intractable, without status migrainosus: Secondary | ICD-10-CM | POA: Diagnosis not present

## 2024-04-09 DIAGNOSIS — R55 Syncope and collapse: Secondary | ICD-10-CM | POA: Diagnosis not present

## 2024-04-09 DIAGNOSIS — R0602 Shortness of breath: Secondary | ICD-10-CM

## 2024-04-09 DIAGNOSIS — W19XXXA Unspecified fall, initial encounter: Secondary | ICD-10-CM | POA: Diagnosis not present

## 2024-04-09 DIAGNOSIS — R4020 Unspecified coma: Secondary | ICD-10-CM | POA: Diagnosis not present

## 2024-04-09 LAB — COMPREHENSIVE METABOLIC PANEL WITH GFR
ALT: 11 U/L (ref 0–35)
AST: 14 U/L (ref 0–37)
Albumin: 4.3 g/dL (ref 3.5–5.2)
Alkaline Phosphatase: 81 U/L (ref 39–117)
BUN: 20 mg/dL (ref 6–23)
CO2: 29 meq/L (ref 19–32)
Calcium: 9.7 mg/dL (ref 8.4–10.5)
Chloride: 101 meq/L (ref 96–112)
Creatinine, Ser: 0.65 mg/dL (ref 0.40–1.20)
GFR: 94.48 mL/min (ref >60.00)
Glucose, Bld: 133 mg/dL — ABNORMAL HIGH (ref 70–99)
Potassium: 4.2 meq/L (ref 3.5–5.1)
Sodium: 138 meq/L (ref 135–145)
Total Bilirubin: 0.7 mg/dL (ref 0.2–1.2)
Total Protein: 7.5 g/dL (ref 6.0–8.3)

## 2024-04-09 LAB — CBC
HCT: 47.1 % — ABNORMAL HIGH (ref 36.0–46.0)
Hemoglobin: 15.3 g/dL — ABNORMAL HIGH (ref 12.0–15.0)
MCHC: 32.6 g/dL (ref 30.0–36.0)
MCV: 88.9 fl (ref 78.0–100.0)
Platelets: 291 K/uL (ref 150.0–400.0)
RBC: 5.3 Mil/uL — ABNORMAL HIGH (ref 3.87–5.11)
RDW: 14.1 % (ref 11.5–15.5)
WBC: 7.4 K/uL (ref 4.0–10.5)

## 2024-04-09 LAB — TSH: TSH: 3.54 u[IU]/mL (ref 0.35–5.50)

## 2024-04-09 NOTE — Assessment & Plan Note (Signed)
 Chronic.  No concerns today.

## 2024-04-09 NOTE — Telephone Encounter (Signed)
 FYI Only or Action Required?: Action required by provider: request for appointment.  Patient was last seen in primary care on 12/23/2023 by Vincente Shivers, NP.  Called Nurse Triage reporting Fall.  Symptoms began a week ago.  Interventions attempted: Rest, hydration, or home remedies.  Symptoms are: stable.  Triage Disposition: See HCP Within 4 Hours (Or PCP Triage)  Patient/caregiver understands and will follow disposition?: YesCopied from CRM #[id number]. Topic: Clinical - Red Word Triage >> Apr 09, 2024 10:15 AM Rea BROCKS wrote: Red Word that prompted transfer to Nurse Triage: Patient passed out over the weekend a couple times and has a bump on her head. Patient has blood pressure concerns. Reason for Disposition  [1] MODERATE weakness (e.g., interferes with work, school, normal activities) AND [2] new-onset or getting worse  Answer Assessment - Initial Assessment Questions Pt had 2 falls over weekend. I just wake up on floor. I guess I'm passing out. I was making a sandwich and woke up with headache. Had a knot on back of head. I got up and started hyperventilating. HR went up. I went to put bread back up and felt it coming on. I held myself between doorway and woke up on floor again.   Bump on back/right side of  head has gone done a lot.No episodes since last weekend.  I get winded at times and want to talk to my dr about that.      1. MECHANISM: How did the fall happen?     I passed out 2. DOMESTIC VIOLENCE AND ELDER ABUSE SCREENING: Did you fall because someone pushed you or tried to hurt you? If Yes, ask: Are you safe now?     na 3. ONSET: When did the fall happen? (e.g., minutes, hours, or days ago)     Over weekend 4. LOCATION: What part of the body hit the ground? (e.g., back, buttocks, head, hips, knees, hands, head, stomach)     Not sure 5. INJURY: Did you hurt (injure) yourself when you fell? If Yes, ask: What did you injure? Tell me more about this?  (e.g., body area; type of injury; pain severity)     Had headache 6. PAIN: Is there any pain? If Yes, ask: How bad is the pain? (e.g., Scale 0-10; or none, mild,      denies 7. SIZE: For cuts, bruises, or swelling, ask: How large is it? (e.g., inches or centimeters)      Bump on head- gone down now but was 2-3 inches 9. OTHER SYMPTOMS: Do you have any other symptoms? (e.g., dizziness, fever, weakness; new-onset or worsening).      denies 10. CAUSE: What do you think caused the fall (or falling)? (e.g., dizzy spell, tripped)       Not sure  Protocols used: Falls and [facility]

## 2024-04-09 NOTE — Telephone Encounter (Signed)
 Patient seen today in office

## 2024-04-09 NOTE — Progress Notes (Signed)
 Established Patient Office Visit  Subjective   Patient ID: Krystal Smith, female    DOB: 1961-06-23  Age: 63 y.o. MRN: [id number]  Chief Complaint  Patient presents with   Loss of Consciousness    Patient states it has happened twice Saturday within minutes    Loss of Consciousness Associated symptoms include dizziness. Pertinent negatives include no abdominal pain, chest pain, fever, headaches, nausea or vomiting.    Krystal Smith is a 63 year old female with past medical history of migraine, IBS, HLD, depression, anxiety, bipolar disorder presents today for an acute visit.   Discussed the use of AI scribe software for clinical note transcription with the patient, who gave verbal consent to proceed.  History of Present Illness Krystal Smith is a 63 year old female who presents with two episodes of syncope and head trauma.  She experienced two falls on Saturday, both resulting in loss of consciousness and head trauma. During the first incident, she awoke on the floor after passing out and hitting her head. She was alone at the time, with her husband upstairs, and is unsure how long she was unconscious. After regaining consciousness, she experienced a headache and a knot on her head.  The second fall occurred shortly after the first, while she was putting bread back in the pantry. She describes feeling like she was hyperventilating, with a rapid heart rate, and attempted breathing exercises to calm herself. Despite these efforts, she felt herself passing out again and hit her head once more. She does not recall how long she was unconscious during the second fall.  She has a history of anxiety and depression but denies having an anxiety attack at the time of the falls. She confirms taking her Lamictal and other medications that morning.  She has a history of migraines but states that her current headaches are not related to the falls. She experiences morning  headaches occasionally and prefers to manage them with breathing exercises rather than medication.  She reports chronic shortness of breath, which is being monitored by her pulmonologist. She has not smoked in 45 years and uses an albuterol  inhaler as needed. She has a CPAP machine for sleep apnea but is not currently using it.    Patient Active Problem List   Diagnosis Date Noted   Fall 04/09/2024   Loss of consciousness (HCC) 04/09/2024   Shortness of breath 04/09/2024   Polyp of ascending colon 12/31/2023   Screening for colon cancer 12/31/2023   Mixed hyperlipidemia 12/23/2023   Establishing care with new doctor, encounter for 12/23/2023   Increased risk of breast cancer 04/20/2018   Prediabetes 03/15/2018   Rosacea    Vitamin D deficiency    Migraine    IBS (irritable bowel syndrome)    Family history of breast cancer    Depression    Bipolar disorder (HCC)    Anxiety    Allergic rhinitis    Hematoma complicating a procedure 06/22/2014   Breast microcalcification, mammographic 11/26/2013   Past Medical History:  Diagnosis Date   Allergic rhinitis    Anxiety    Asthma    Bacterial pneumonia 12/2008   HOSP X2D   Bipolar disorder (HCC)    BRCA negative 2013; 8/19   2013 BRCA neg; 2019 MyRisk neg except MSH3 VUS   Depression    Family history of breast cancer    mother age 64, mat aunt-two primary br cas in either breast-first cancer age 54, mgac breast  and pancreatic   Fibroadenoma of breast    IBS (irritable bowel syndrome)    Increased risk of breast cancer 02/2018   IBIS=24.7%/riskscore=37.6%   Infertility    Migraine    Rosacea    Vitamin D deficiency    Past Surgical History:  Procedure Laterality Date   BREAST BIOPSY Right 2000   fibroadenoma, core biopsy.   BREAST BIOPSY Left 06/21/2014   stereotatic, fibrocystic changes with microcalcifications. BRCA testing neagative   BREAST CYST ASPIRATION Left 04/2006   BREAST CYST ASPIRATION Right 2007    CHOLECYSTECTOMY  10/2019   COLONOSCOPY N/A 12/31/2023   Procedure: COLONOSCOPY;  Surgeon: Jinny Carmine, MD;  Location: [facility] ENDOSCOPY;  Service: Endoscopy;  Laterality: N/A;   NASAL SINUS SURGERY  2004   PECTUS EXCAVATUM REPAIR     age 67 at [facility]   POLYPECTOMY  12/31/2023   Procedure: POLYPECTOMY, INTESTINE;  Surgeon: Jinny Carmine, MD;  Location: [facility] ENDOSCOPY;  Service: Endoscopy;;   Repair left ankle fracture     TONSILLECTOMY     WISDOM TOOTH EXTRACTION     Allergies  Allergen Reactions   Amitriptyline Other (See Comments)    Nervous ending pulse   Amoxil [Amoxicillin]    Corn-Containing Products     And other foods - diarrhea   Depakote [Divalproex Sodium] Other (See Comments)    Uncontrolled muscle movement   Erythromycin    Ultracet [Tramadol-Acetaminophen ] Itching   Imitrex [Sumatriptan] Palpitations    Worse migraine   Skelaxin [Metaxalone] Palpitations    migraine   Tetracyclines & Related Rash   Venlafaxine Other (See Comments)    Night sweats.         12/23/2023   12:22 PM 03/14/2018    3:47 PM  Depression screen PHQ 2/9  Decreased Interest 0 0  Down, Depressed, Hopeless 0 0  PHQ - 2 Score 0 0        No data to display            Review of Systems  Constitutional:  Negative for chills and fever.  Respiratory:  Positive for shortness of breath.   Cardiovascular:  Positive for syncope. Negative for chest pain.  Gastrointestinal:  Negative for abdominal pain, constipation, diarrhea, heartburn, nausea and vomiting.  Genitourinary:  Negative for dysuria, frequency and urgency.  Neurological:  Positive for dizziness. Negative for headaches.  Endo/Heme/Allergies:  Negative for polydipsia.  Psychiatric/Behavioral:  Negative for depression and suicidal ideas. The patient is not nervous/anxious.       Objective:     BP 110/80   Pulse 86   Temp 97.9 F (36.6 C) (Temporal)   Ht 5' 1 (1.549 m)   Wt 183 lb (83 kg)   SpO2 91%   BMI 34.58 kg/m  BP  Readings from Last 3 Encounters:  04/09/24 110/80  12/31/23 138/60  12/23/23 118/76   Wt Readings from Last 3 Encounters:  04/09/24 183 lb (83 kg)  12/31/23 179 lb 6.4 oz (81.4 kg)  12/23/23 180 lb (81.6 kg)      Physical Exam Vitals and nursing note reviewed.  Constitutional:      Appearance: Normal appearance.  Cardiovascular:     Rate and Rhythm: Normal rate and regular rhythm.     Pulses: Normal pulses.     Heart sounds: Normal heart sounds.  Pulmonary:     Effort: Pulmonary effort is normal.     Breath sounds: Normal breath sounds.  Neurological:     Mental Status: She  is alert and oriented to person, place, and time.     Cranial Nerves: Cranial nerves 2-12 are intact.     Sensory: Sensation is intact.     Motor: Motor function is intact.     Coordination: Coordination is intact.     Gait: Gait is intact.  Psychiatric:        Mood and Affect: Mood normal.        Behavior: Behavior normal.        Thought Content: Thought content normal.        Judgment: Judgment normal.      No results found for any visits on 04/09/24.     The 10-year ASCVD risk score (Arnett DK, et al., 2019) is: 2.9%    Assessment & Plan:  Loss of consciousness (HCC) -     CT HEAD WO CONTRAST ( ); Future  Fall, initial encounter -     CBC -     Comprehensive metabolic panel with GFR -     TSH -     CT HEAD WO CONTRAST ( ); Future  Shortness of breath Assessment & Plan: Chronic.  Reviewed pulmonology notes from June.  Stable for outpatient treatment.  No distress. Lungs clear.   Migraine without status migrainosus, not intractable, unspecified migraine type Assessment & Plan: Chronic.  No concerns today.     Assessment and Plan Assessment & Plan Falls with loss of consciousness and head injury Two falls with loss of consciousness and head injury. Differential includes blood pressure issues, thyroid  dysfunction, or systemic causes. No current headaches or anxiety  attacks related to falls. - Neuro exam stable. No red flags on exam. Stable for outpatient treatment.  - labs pending. - STAT CT of the head pending. - await results.   Return in about 2 weeks (around 04/23/2024) for chronic care management and fasting labs.SABRA Carrol Aurora, NP

## 2024-04-09 NOTE — Assessment & Plan Note (Signed)
 Chronic.  Reviewed pulmonology notes from June.  Stable for outpatient treatment.  No distress. Lungs clear.

## 2024-04-09 NOTE — Patient Instructions (Addendum)
 Your recent lab tests have resulted, and my comments are displayed below. Please do not hesitate to reach out with any questions!  Someone will call you regarding your appointment for the CT.   Follow up in two weeks ; come fasting.   It was a pleasure to see you today!

## 2024-04-10 ENCOUNTER — Ambulatory Visit: Payer: Self-pay | Admitting: General Practice

## 2024-04-23 ENCOUNTER — Ambulatory Visit: Admitting: General Practice

## 2024-04-23 ENCOUNTER — Encounter: Payer: Self-pay | Admitting: General Practice

## 2024-04-23 VITALS — BP 118/62 | HR 85 | Temp 98.2°F | Ht 61.0 in | Wt 182.0 lb

## 2024-04-23 DIAGNOSIS — R7303 Prediabetes: Secondary | ICD-10-CM | POA: Diagnosis not present

## 2024-04-23 DIAGNOSIS — E559 Vitamin D deficiency, unspecified: Secondary | ICD-10-CM | POA: Diagnosis not present

## 2024-04-23 DIAGNOSIS — R6883 Chills (without fever): Secondary | ICD-10-CM | POA: Diagnosis not present

## 2024-04-23 DIAGNOSIS — E782 Mixed hyperlipidemia: Secondary | ICD-10-CM | POA: Diagnosis not present

## 2024-04-23 LAB — LIPID PANEL
Cholesterol: 210 mg/dL — ABNORMAL HIGH (ref 0–200)
HDL: 62.1 mg/dL (ref >39.00)
LDL Cholesterol: 113 mg/dL — ABNORMAL HIGH (ref 0–99)
NonHDL: 147.93
Total CHOL/HDL Ratio: 3
Triglycerides: 177 mg/dL — ABNORMAL HIGH (ref 0.0–149.0)
VLDL: 35.4 mg/dL (ref 0.0–40.0)

## 2024-04-23 LAB — VITAMIN D 25 HYDROXY (VIT D DEFICIENCY, FRACTURES): VITD: 12.43 ng/mL — ABNORMAL LOW (ref 30.00–100.00)

## 2024-04-23 LAB — HEMOGLOBIN A1C: Hgb A1c MFr Bld: 6.8 % — ABNORMAL HIGH (ref 4.6–6.5)

## 2024-04-23 NOTE — Patient Instructions (Signed)
 Stop by the lab prior to leaving today. I will notify you of your results once received.   Follow up in 6 months chronic care management.   It was a pleasure to see you today!

## 2024-04-23 NOTE — Progress Notes (Signed)
 Established Patient Office Visit  Subjective   Patient ID: Krystal Smith, female    DOB: Nov 26, 1960  Age: 63 y.o. MRN: [id number]  Chief Complaint  Patient presents with   chronic care management    Patient here today to follow up on chronic conditions.    Chills    X 2 days; no other sx.     HPI  Krystal Smith is a 63 year old female with past medical history of migraine, IBS, rosacea, vitamin d deficiency, depression, bipolar disorder, anxiety, prediabetes, HLD presents today for chronic care management.   Discussed the use of AI scribe software for clinical note transcription with the patient, who gave verbal consent to proceed.  History of Present Illness Krystal Smith is a 63 year old female who presents for chronic care management follow-up.  She experienced a recent fall but confirms that her head CT was normal, and she did not sustain any serious injuries. She has a bump on her head. No headaches, blurred vision or other neurological symptoms.  She has a history of depression and is currently managed by psychiatry. Her medications include Lamictal 200 mg daily, sertraline 200 mg daily, and trazodone 200 mg at night, which she sometimes reduces to 100 mg if she feels she doesn't need the full dose.  She suffers from chronic migraines and takes  Excedrin as needed for flare-ups.  She has a history of IBS, which is improving. Avoiding certain foods and using a cough remedy helps manage her symptoms. She experiences soft stools and occasional diarrhea, which she attributes to her gallbladder.  She has rosacea and uses Ivermectin  1% cream and Oracea  40 mg as needed. She has a dermatology appointment scheduled for November 21st.  Her A1c was 6.4 in April. She has been trying to monitor her diet. Exercise is limited.   She has a history of vitamin D deficiency and reports low energy and shortness of breath. She is scheduled to see a pulmonologist  next week.  She experiences seasonal allergies, particularly when red maples are in bloom, and uses antihistamines and Flonase as needed. She has chronic nasal congestion and difficulty breathing through her nose, which has persisted for 20 years since a sinus cleaning procedure.  She has a family history of breast cancer and has completed her mammogram as ordered by her GYN.    Patient Active Problem List   Diagnosis Date Noted   Fall 04/09/2024   Loss of consciousness (HCC) 04/09/2024   Shortness of breath 04/09/2024   Polyp of ascending colon 12/31/2023   Screening for colon cancer 12/31/2023   Mixed hyperlipidemia 12/23/2023   Establishing care with new doctor, encounter for 12/23/2023   Increased risk of breast cancer 04/20/2018   Prediabetes 03/15/2018   Rosacea    Vitamin D deficiency    Migraine    IBS (irritable bowel syndrome)    Family history of breast cancer    Depression    Bipolar disorder (HCC)    Anxiety    Allergic rhinitis    Hematoma complicating a procedure 06/22/2014   Breast microcalcification, mammographic 11/26/2013   Past Medical History:  Diagnosis Date   Allergic rhinitis    Anxiety    Asthma    Bacterial pneumonia 12/2008   HOSP X2D   Bipolar disorder (HCC)    BRCA negative 2013; 8/19   2013 BRCA neg; 2019 MyRisk neg except MSH3 VUS   Depression    Family history of breast  cancer    mother age 46, mat aunt-two primary br cas in either breast-first cancer age 62, mgac breast and pancreatic   Fibroadenoma of breast    IBS (irritable bowel syndrome)    Increased risk of breast cancer 02/2018   IBIS=24.7%/riskscore=37.6%   Infertility    Migraine    Rosacea    Vitamin D deficiency    Past Surgical History:  Procedure Laterality Date   BREAST BIOPSY Right 2000   fibroadenoma, core biopsy.   BREAST BIOPSY Left 06/21/2014   stereotatic, fibrocystic changes with microcalcifications. BRCA testing neagative   BREAST CYST ASPIRATION Left  04/2006   BREAST CYST ASPIRATION Right 2007   CHOLECYSTECTOMY  10/2019   COLONOSCOPY N/A 12/31/2023   Procedure: COLONOSCOPY;  Surgeon: Jinny Carmine, MD;  Location: [facility] ENDOSCOPY;  Service: Endoscopy;  Laterality: N/A;   NASAL SINUS SURGERY  2004   PECTUS EXCAVATUM REPAIR     age 72 at [facility]   POLYPECTOMY  12/31/2023   Procedure: POLYPECTOMY, INTESTINE;  Surgeon: Jinny Carmine, MD;  Location: [facility] ENDOSCOPY;  Service: Endoscopy;;   Repair left ankle fracture     TONSILLECTOMY     WISDOM TOOTH EXTRACTION     Allergies  Allergen Reactions   Amitriptyline Other (See Comments)    Nervous ending pulse   Amoxil [Amoxicillin]    Corn-Containing Products     And other foods - diarrhea   Depakote [Divalproex Sodium] Other (See Comments)    Uncontrolled muscle movement   Erythromycin    Ultracet [Tramadol-Acetaminophen ] Itching   Imitrex [Sumatriptan] Palpitations    Worse migraine   Skelaxin [Metaxalone] Palpitations    migraine   Tetracyclines & Related Rash   Venlafaxine Other (See Comments)    Night sweats.         04/23/2024    9:54 AM 04/09/2024   11:40 AM 12/23/2023   12:22 PM  Depression screen PHQ 2/9  Decreased Interest 0 0 0  Down, Depressed, Hopeless 0 0 0  PHQ - 2 Score 0 0 0  Altered sleeping 0 0   Tired, decreased energy 0 0   Change in appetite 0 0   Feeling bad or failure about yourself  0 0   Trouble concentrating 0 0   Moving slowly or fidgety/restless 0 0   Suicidal thoughts 0 0   PHQ-9 Score 0 0   Difficult doing work/chores Not difficult at all Not difficult at all        04/23/2024    9:54 AM 04/09/2024   11:40 AM  GAD 7 : Generalized Anxiety Score  Nervous, Anxious, on Edge 0 0  Control/stop worrying 0 0  Worry too much - different things 0 0  Trouble relaxing 0 0  Restless 0 0  Easily annoyed or irritable 0 0  Afraid - awful might happen 0 0  Total GAD 7 Score 0 0  Anxiety Difficulty Not difficult at all Not difficult at all      Review  of Systems  Constitutional:  Positive for chills. Negative for fever.  Respiratory:  Negative for shortness of breath.   Cardiovascular:  Negative for chest pain.  Gastrointestinal:  Negative for abdominal pain, constipation, diarrhea, heartburn, nausea and vomiting.  Genitourinary:  Negative for dysuria, frequency and urgency.  Neurological:  Negative for dizziness and headaches.  Endo/Heme/Allergies:  Negative for polydipsia.  Psychiatric/Behavioral:  Negative for depression and suicidal ideas. The patient is not nervous/anxious.       Objective:  BP 118/62   Pulse 85   Temp 98.2 F (36.8 C) (Temporal)   Ht 5' 1 (1.549 m)   Wt 182 lb (82.6 kg)   SpO2 93%   BMI 34.39 kg/m  BP Readings from Last 3 Encounters:  04/23/24 118/62  04/09/24 110/80  12/31/23 138/60   Wt Readings from Last 3 Encounters:  04/23/24 182 lb (82.6 kg)  04/09/24 183 lb (83 kg)  12/31/23 179 lb 6.4 oz (81.4 kg)      Physical Exam Vitals and nursing note reviewed.  Constitutional:      Appearance: Normal appearance.  Cardiovascular:     Rate and Rhythm: Normal rate and regular rhythm.     Pulses: Normal pulses.     Heart sounds: Normal heart sounds.  Pulmonary:     Effort: Pulmonary effort is normal.     Breath sounds: Normal breath sounds.  Neurological:     Mental Status: She is alert and oriented to person, place, and time.  Psychiatric:        Mood and Affect: Mood normal.        Behavior: Behavior normal.        Thought Content: Thought content normal.        Judgment: Judgment normal.      No results found for any visits on 04/23/24.     The 10-year ASCVD risk score (Arnett DK, et al., 2019) is: 3.4%    Assessment & Plan:  Prediabetes -     Hemoglobin A1c  Mixed hyperlipidemia -     Lipid panel  Vitamin D deficiency -     VITAMIN D 25 Hydroxy (Vit-D Deficiency, Fractures)  Chills (without fever)    Assessment and Plan Assessment & Plan Shortness of  breath Ongoing shortness of breath under evaluation by pulmonology. - Pulmonology follow-up scheduled for further evaluation.  Loss of consciousness and fall Recent fall with no significant injury. Normal head CT ruled out subdural hematoma or bleed.  Major depressive disorder Managed by psychiatry with current medications: Lamictal 200 mg daily, sertraline 200 mg daily, and trazodone 200 mg at night as needed.  Chronic migraine Chronic migraines managed with medication as needed. No recent flare-ups reported.  Irritable bowel syndrome with post-cholecystectomy diarrhea IBS symptoms improving with dietary management. Occasional soft stools and diarrhea attributed to post-cholecystectomy changes.  Allergic rhinitis with chronic nasal obstruction Chronic nasal obstruction likely due to allergic rhinitis. No significant paranasal sinus disease on CT. - Recommend daily antihistamine and Flonase for symptom management.  Rosacea Managed with topical creams. Dermatology follow-up scheduled for November 21st.  Hyperlipidemia with atherosclerotic calcification Atherosclerotic calcification noted on head CT, likely due to hyperlipidemia. Importance of cholesterol management discussed to prevent heart attack and stroke. - Check cholesterol levels with current blood work.  Vitamin D deficiency Vitamin D levels not checked recently. Reports low energy, which may be related to vitamin D deficiency. - Vitamin d level pending.   Return in about 6 months (around 10/22/2024) for chronic care management.    Carrol Aurora, NP

## 2024-04-24 ENCOUNTER — Ambulatory Visit: Payer: Self-pay | Admitting: General Practice

## 2024-05-05 ENCOUNTER — Ambulatory Visit: Admitting: Pulmonary Disease

## 2024-05-05 ENCOUNTER — Encounter: Payer: Self-pay | Admitting: Pulmonary Disease

## 2024-05-05 VITALS — BP 104/60 | HR 90 | Temp 97.6°F | Ht 61.0 in | Wt 183.6 lb

## 2024-05-05 DIAGNOSIS — Z87891 Personal history of nicotine dependence: Secondary | ICD-10-CM | POA: Diagnosis not present

## 2024-05-05 DIAGNOSIS — R0602 Shortness of breath: Secondary | ICD-10-CM

## 2024-05-05 DIAGNOSIS — R55 Syncope and collapse: Secondary | ICD-10-CM

## 2024-05-05 DIAGNOSIS — R0600 Dyspnea, unspecified: Secondary | ICD-10-CM

## 2024-05-05 LAB — NITRIC OXIDE: Nitric Oxide: 16

## 2024-05-05 NOTE — Progress Notes (Unsigned)
 Subjective:    Patient ID: Krystal Smith, female    DOB: Dec 05, 1960, 63 y.o.   MRN: [id number]  Patient Care Team: Vincente Shivers, NP as PCP - General (General Practice) Arloa Lamar SQUIBB, MD as Referring Physician (Obstetrics and Gynecology) Dessa Reyes ORN, MD (General Surgery)  Chief Complaint  Patient presents with   Shortness of Breath    Shortness of breath on exertion. Occasional wheezing and cough, mostly at night.     BACKGROUND/INTERVAL:63 year old remote former smoker with a history as noted below who follows for the issue of dyspnea.  She was last seen on 12 Dec 2023.  At that time PFTs were ordered which she has not completed.  Overnight oximetry confirmed oxygen desaturations.  HPI Discussed the use of AI scribe software for clinical note transcription with the patient, who gave verbal consent to proceed.  History of Present Illness   She is a 63 year old female who presents with shortness of breath.  She continues to experience persistent shortness of breath. She was unable to complete the previously scheduled pulmonary function test due to needing to care for her 58 year old mother in [place] after a fall.  She uses a concentrator set to two liters at night, although she sometimes does not use it if she is not at home. She reports having had a sleep study in the past. She recalls an incident where she passed out twice after returning from a high-altitude location in [place], [place], which was associated with a severe headache and hyperventilation.  She takes trazodone for sleep and sometimes wakes up to use the bathroom but can usually return to sleep. She has had a sleep study done years ago.      DATA: 07/26/2015 PFTs: Performed at [facility] clinic, FEV1 1.85 L or 80% predicted, FVC 2.19 L or 79% predicted.  FEV1/FVC 84%.  Diffusion capacity normal.  Flow volume loop normal.  Essentially normal PFTs. 05/20/2019 one 2D echo: LVEF 60 to 65%, no diastolic  dysfunction. 08/02/2020 PFTs: FEV1 1.79 L or 71% predicted, FVC 1.97 L or 60% predicted, FEV1/FVC 91%, ERV 30%, diminished.  Flow volume loop normal.  Diffusion capacity normal.  Consistent with pseudo restriction due to obesity.   Review of Systems A 10 point review of systems was performed and it is as noted above otherwise negative.   Patient Active Problem List   Diagnosis Date Noted   Fall 04/09/2024   Loss of consciousness (HCC) 04/09/2024   Shortness of breath 04/09/2024   Polyp of ascending colon 12/31/2023   Screening for colon cancer 12/31/2023   Mixed hyperlipidemia 12/23/2023   Establishing care with new doctor, encounter for 12/23/2023   Increased risk of breast cancer 04/20/2018   Prediabetes 03/15/2018   Rosacea    Vitamin D deficiency    Migraine    IBS (irritable bowel syndrome)    Family history of breast cancer    Depression    Bipolar disorder (HCC)    Anxiety    Allergic rhinitis    Hematoma complicating a procedure 06/22/2014   Breast microcalcification, mammographic 11/26/2013    Social History   Tobacco Use   Smoking status: Former    Current packs/day: 0.00    Types: Cigarettes    Quit date: 1984    Years since quitting: 41.8   Smokeless tobacco: Never   Tobacco comments:    Quit in 1994- 05/14/2022- khj  patient states she has not smoked in 30 years  Substance Use Topics  Alcohol use: Yes    Alcohol/week: 0.0 standard drinks of alcohol    Comment: 0-1 BEER    Allergies  Allergen Reactions   Amitriptyline Other (See Comments)    Nervous ending pulse   Amoxil [Amoxicillin]    Corn-Containing Products     And other foods - diarrhea   Depakote [Divalproex Sodium] Other (See Comments)    Uncontrolled muscle movement   Erythromycin    Ultracet [Tramadol-Acetaminophen ] Itching   Imitrex [Sumatriptan] Palpitations    Worse migraine   Skelaxin [Metaxalone] Palpitations    migraine   Tetracyclines & Related Rash   Venlafaxine Other (See  Comments)    Night sweats.    Current Meds  Medication Sig   albuterol  (VENTOLIN  HFA) 108 (90 Base) MCG/ACT inhaler TAKE 2 PUFFS BY MOUTH EVERY 6 HOURS AS NEEDED FOR WHEEZE OR SHORTNESS OF BREATH   aspirin-acetaminophen -caffeine  (EXCEDRIN MIGRAINE) 250-250-65 MG tablet Take 1 tablet by mouth every 6 (six) hours as needed for headache.   ibuprofen (ADVIL) 200 MG tablet Take 200 mg by mouth as needed.   Ivermectin  (SOOLANTRA ) 1 % CREA Apply topically daily.   lamoTRIgine (LAMICTAL) 200 MG tablet Take 200 mg by mouth daily.    Melatonin 3 MG TABS Take 12 mg by mouth as needed.   ORACEA  40 MG CPDR Take 40 mg by mouth as needed.   sertraline (ZOLOFT) 100 MG tablet Take 200 mg by mouth every morning.   traZODone (DESYREL) 100 MG tablet Take 1 tablet by mouth as needed.    Immunization History  Administered Date(s) Administered   Moderna Covid-19 Vaccine Bivalent Booster 24yrs & up 02/16/2022   Moderna Sars-Covid-2 Vaccination 09/27/2019, 11/05/2019, 08/09/2020   Tdap 12/23/2023        Objective:     BP 104/60   Pulse 90   Temp 97.6 F (36.4 C) (Temporal)   Ht 5' 1 (1.549 m)   Wt 183 lb 9.6 oz (83.3 kg)   SpO2 95%   BMI 34.69 kg/m   SpO2: 95 %  GENERAL: Obese woman, fully ambulatory, no acute distress. HEAD: Normocephalic, atraumatic.  EYES: Pupils equal, round, reactive to light.  No scleral icterus.  Mild exophthalmos. MOUTH: Dentition intact, oral mucosa moist.  No thrush. NECK: Thick neck.  Cannot make further assessment. PULMONARY: Good air entry bilaterally.  No adventitious sounds. CARDIOVASCULAR: S1 and S2. Regular rate and rhythm.  Grade 1-2/6 systolic ejection murmur left sternal border. ABDOMEN: Benign.   MUSCULOSKELETAL: No joint deformity, no clubbing, no edema.  NEUROLOGIC: No deficit, no gait disturbance.  Speech is fluent. SKIN: Intact,warm,dry. PSYCH: Mood and behavior normal.:  Lab Results  Component Value Date   NITRICOXIDE 16 05/05/2024  *This  result suggests low (<25) Type II (T2) airway inflammation indicating a low likelihood of active T2-driven airway inflammation.  In a patient with active T2 driven asthma management it suggests good control.        Assessment & Plan:     ICD-10-CM   1. Dyspnea, unspecified type  R06.00 Pulmonary function test      Orders Placed This Encounter  Procedures   Nitric oxide    Discussion:    Shortness of breath Persistent shortness of breath with no evidence of asthma. Previous breathing test was postponed due to personal circumstances. Oxygen desaturation occurs during sleep, managed with a concentrator set at two liters. A sleep study was conducted years ago with Dr. Theotis and was reportedly negative. - Ordered pulmonary function tests to determine cause  of shortness of breath - Checked level of inflammation in the airway: No evidence of increased nitric oxide  - Scheduled follow-up appointment in two months  Syncope Two episodes of syncope, one post-trip to [place], [place], and another while applying peanut butter. Episodes associated with hyperventilation and severe headache. Previous tests conducted by another provider.  Follow-up with primary practitioner and or cardiology.     Advised if symptoms do not improve or worsen, to please contact office for sooner follow up or seek emergency care.    I spent 30 minutes of dedicated to the care of this patient on the date of this encounter to include pre-visit review of records, face-to-face time with the patient discussing conditions above, post visit ordering of testing, clinical documentation with the electronic health record, making appropriate referrals as documented, and communicating necessary findings to members of the patients care team.     C. Leita Sanders, MD Advanced Bronchoscopy PCCM [facility]    *This note was generated using voice recognition software/Dragon and/or AI transcription program.   Despite best efforts to proofread, errors can occur which can change the meaning. Any transcriptional errors that result from this process are unintentional and may not be fully corrected at the time of dictation.

## 2024-05-05 NOTE — Patient Instructions (Signed)
 VISIT SUMMARY:  You came in today because you have been experiencing persistent shortness of breath. We discussed your symptoms, including your use of a concentrator at night and your past sleep study. We also reviewed your history of passing out twice, once after a trip to a high-altitude location and another time while applying peanut butter.  YOUR PLAN:  -SHORTNESS OF BREATH: Shortness of breath means you have difficulty breathing or feel like you can't get enough air. We have ordered breathing tests to help determine the cause of your shortness of breath and checked the level of inflammation in your airway. Please continue using your concentrator set to two liters at night, and we will follow up in two months.  -SYNCOPE: Syncope means you fainted or passed out. You had two episodes of syncope, one after a trip to a high-altitude location and another while applying peanut butter. These episodes were associated with hyperventilation and severe headache.  Continue to follow-up with your primary practitioner in this regard.  INSTRUCTIONS:  Please complete the breathing tests as ordered and continue using your concentrator at night. We have scheduled a follow-up appointment in two months to review your test results and discuss your symptoms further.

## 2024-05-08 ENCOUNTER — Encounter: Payer: Self-pay | Admitting: Pulmonary Disease

## 2024-07-01 DIAGNOSIS — F5105 Insomnia due to other mental disorder: Secondary | ICD-10-CM | POA: Diagnosis not present

## 2024-07-01 DIAGNOSIS — F39 Unspecified mood [affective] disorder: Secondary | ICD-10-CM | POA: Diagnosis not present

## 2024-07-01 DIAGNOSIS — F410 Panic disorder [episodic paroxysmal anxiety] without agoraphobia: Secondary | ICD-10-CM | POA: Diagnosis not present

## 2024-07-14 ENCOUNTER — Encounter

## 2024-07-14 ENCOUNTER — Ambulatory Visit: Admitting: Pulmonary Disease

## 2024-10-21 ENCOUNTER — Ambulatory Visit: Admitting: General Practice
